# Patient Record
Sex: Female | Born: 1990 | Race: Black or African American | Hispanic: No | Marital: Single | Smoking: Never smoker
Health system: Southern US, Community
[De-identification: ages and names within clinical notes are randomized; demographics above are authoritative.]

## PROBLEM LIST (undated history)

## (undated) ENCOUNTER — Inpatient Hospital Stay (HOSPITAL_COMMUNITY): Payer: Self-pay

## (undated) DIAGNOSIS — Z789 Other specified health status: Secondary | ICD-10-CM

## (undated) HISTORY — PX: NO PAST SURGERIES: SHX2092

---

## 2012-07-20 ENCOUNTER — Emergency Department (HOSPITAL_COMMUNITY)
Admission: EM | Admit: 2012-07-20 | Discharge: 2012-07-20 | Disposition: A | Discharge status: Home / Self Care | Payer: Self-pay | Attending: Emergency Medicine | Admitting: Emergency Medicine

## 2012-07-20 ENCOUNTER — Encounter (HOSPITAL_COMMUNITY): Payer: Self-pay | Admitting: *Deleted

## 2012-07-20 DIAGNOSIS — R21 Rash and other nonspecific skin eruption: Secondary | ICD-10-CM | POA: Insufficient documentation

## 2012-07-20 MED ORDER — PERMETHRIN 5 % EX CREA: TOPICAL_CREAM | CUTANEOUS | Status: AC

## 2012-07-20 NOTE — ED Notes (Signed)
Pt reports holding a baby this weekend with rash on the baby's arms, started to notice itchy rash on her R hand-nowhere else.   Denies any fever.

## 2012-07-20 NOTE — ED Provider Notes (Signed)
History     CSN: [id number]  Arrival date & time 07/20/12  1245   First MD Initiated Contact with Patient 07/20/12 1525      Chief Complaint  Patient presents with  . Rash    (Consider location/radiation/quality/duration/timing/severity/associated sxs/prior treatment) Patient is a 21 y.o. female presenting with rash. The history is provided by the patient.  Rash  This is a new problem. The current episode started yesterday. Associated symptoms comments: She visited a friend yesterday whose daughter had a rash. Last night the patient started having itching on hands with rash this morning. No other symptoms..    History reviewed. No pertinent past medical history.  History reviewed. No pertinent past surgical history.  No family history on file.  History  Substance Use Topics  . Smoking status: Never Smoker   . Smokeless tobacco: Not on file  . Alcohol Use: No    OB History    Grav Para Term Preterm Abortions TAB SAB Ect Mult Living                  Review of Systems  Constitutional: Negative for fever and chills.  HENT: Negative.   Respiratory: Negative.   Gastrointestinal: Negative.   Musculoskeletal: Negative.   Skin: Positive for rash.    Allergies  Review of patient's allergies indicates no known allergies.  Home Medications  No current outpatient prescriptions on file.  BP 120/77  Pulse 90  Temp 98.1 F (36.7 C) (Oral)  Resp 16  SpO2 100%  LMP 06/23/2012  Physical Exam  Constitutional: She is oriented to person, place, and time. She appears well-developed and well-nourished.  Neck: Normal range of motion.  Pulmonary/Chest: Effort normal.  Neurological: She is alert and oriented to person, place, and time.  Skin: Skin is warm and dry.       Red, maculopapular rash to dorsum right hand. No blisters or pustules. No swelling.     ED Course  Procedures (including critical care time)  Labs Reviewed - No data to display No results found.   No  diagnosis found. 1. Nonspecific rash    MDM  Rash is nonspecific but will treat for scabies given exposure and presenting symptoms.        Rodena Medin, PA-C 07/20/12 1529

## 2012-07-21 NOTE — ED Provider Notes (Signed)
Medical screening examination/treatment/procedure(s) were performed by non-physician practitioner and as supervising physician I was immediately available for consultation/collaboration.   Daleysa Kristiansen M Leiyah Maultsby, MD 07/21/12 0114 

## 2013-01-18 ENCOUNTER — Inpatient Hospital Stay (HOSPITAL_COMMUNITY)
Admission: EM | Admit: 2013-01-18 | Discharge: 2013-01-18 | Disposition: A | Discharge status: Home / Self Care | Payer: 59 | Attending: Obstetrics and Gynecology | Admitting: Obstetrics and Gynecology

## 2013-01-18 ENCOUNTER — Encounter (HOSPITAL_COMMUNITY): Payer: Self-pay | Admitting: Emergency Medicine

## 2013-01-18 ENCOUNTER — Emergency Department (HOSPITAL_COMMUNITY): Payer: 59

## 2013-01-18 DIAGNOSIS — O039 Complete or unspecified spontaneous abortion without complication: Secondary | ICD-10-CM | POA: Insufficient documentation

## 2013-01-18 HISTORY — DX: Other specified health status: Z78.9

## 2013-01-18 LAB — URINALYSIS, ROUTINE W REFLEX MICROSCOPIC
Glucose, UA: NEGATIVE mg/dL
Leukocytes, UA: NEGATIVE
Protein, ur: NEGATIVE mg/dL
pH: 6 (ref 5.0–8.0)

## 2013-01-18 LAB — CBC
HCT: 29.7 % — ABNORMAL LOW (ref 36.0–46.0)
Hemoglobin: 10 g/dL — ABNORMAL LOW (ref 12.0–15.0)
MCHC: 33.7 g/dL (ref 30.0–36.0)
RBC: 3.58 MIL/uL — ABNORMAL LOW (ref 3.87–5.11)

## 2013-01-18 LAB — POCT I-STAT, CHEM 8
HCT: 30 % — ABNORMAL LOW (ref 36.0–46.0)
Hemoglobin: 10.2 g/dL — ABNORMAL LOW (ref 12.0–15.0)
Potassium: 3.9 mEq/L (ref 3.5–5.1)
Sodium: 142 mEq/L (ref 135–145)
TCO2: 27 mmol/L (ref 0–100)

## 2013-01-18 LAB — RPR: RPR Ser Ql: NONREACTIVE

## 2013-01-18 LAB — URINE MICROSCOPIC-ADD ON

## 2013-01-18 LAB — HCG, QUANTITATIVE, PREGNANCY: hCG, Beta Chain, Quant, S: 656 m[IU]/mL — ABNORMAL HIGH (ref <5)

## 2013-01-18 LAB — ABO/RH: ABO/RH(D): O POS

## 2013-01-18 MED ORDER — SODIUM CHLORIDE 0.9 % IV SOLN
INTRAVENOUS | Status: DC
  Administered 2013-01-18: 18:00:00 via INTRAVENOUS

## 2013-01-18 MED ORDER — NORGESTIMATE-ETH ESTRADIOL 0.25-35 MG-MCG PO TABS: 1.0000 | ORAL_TABLET | Freq: Every day | ORAL | Status: DC

## 2013-01-18 MED ORDER — METHYLERGONOVINE MALEATE 0.2 MG/ML IJ SOLN
0.2000 mg | Freq: Once | INTRAMUSCULAR | Status: AC
  Administered 2013-01-18: 0.2 mg via INTRAMUSCULAR
  Filled 2013-01-18: qty 1

## 2013-01-18 MED ORDER — IBUPROFEN 600 MG PO TABS: 600.0000 mg | ORAL_TABLET | Freq: Four times a day (QID) | ORAL | Status: DC | PRN

## 2013-01-18 NOTE — ED Notes (Signed)
Patient transported to Ultrasound 

## 2013-01-18 NOTE — MAU Provider Note (Signed)
  History     CSN: [id number]  Arrival date and time: 01/18/13 1427   First Elijan Googe Initiated Contact with Patient 01/18/13 1958      Chief Complaint  Patient presents with  . Vaginal Bleeding   HPI HPI  The patient presents to the emergency room with complaints of a probable miscarriage. Her last menstrual period was sometime in December. Patient (G2P1 , prior spontaneous ab) states that 2 weeks ago she believes she had a miscarriage. She had one previously so she was not too concerned. She never saw an OB GYN with this pregnancy but had taken a home pregnancy test. She noticed heavy menstrual bleeding. She is passing clots but does not recall if she passed any tissue. The patient started bleeding heavily again today. She's having lower abdominal cramping as well. The patient states that she's not sure if she had a fever yesterday. She did not measure her temperature she developed a mild headache and had to take some Tylenol. The patient denies any numbness or weakness. No vomiting or diarrhea. She does not feel lightheaded.  History reviewed. No pertinent past medical history. PATIENT IS SENT TO [facility] FOR CONTINUED EVAL AND CARE. SHE IS NOT CRAMPING, AND DR  Lynelle Doctor was able to extract tissue from os, and bleeding has essentially stopped    Past Medical History  Diagnosis Date  . Medical history non-contributory     Past Surgical History  Procedure Laterality Date  . No past surgeries      History reviewed. No pertinent family history.  History  Substance Use Topics  . Smoking status: Never Smoker   . Smokeless tobacco: Not on file  . Alcohol Use: No    Allergies: No Known Allergies  Prescriptions prior to admission  Medication Sig Dispense Refill  . aspirin 325 MG tablet Take 650 mg by mouth once.      Marland Kitchen BIOTIN PO Take 1 capsule by mouth daily.        ROS Physical Exam   Blood pressure 128/62, pulse 80, temperature 98.6 F (37 C), temperature source Oral, resp. rate  18, height 5\' 7"  (1.702 m), weight 90.719 kg (200 lb), last menstrual period 10/16/2012, SpO2 98.00%.  Physical Exam Physical Examination: General appearance - alert, well appearing, and in no distress, oriented to person, place, and time, acyanotic, in no respiratory distress, well hydrated and comfortable Mental status - alert, oriented to person, place, and time, normal mood, behavior, speech, dress, motor activity, and thought processes Pelvic - normal external genitalia, vulva, vagina, cervix, uterus and adnexa, VULVA: normal appearing vulva with no masses, tenderness or lesions, VAGINA: normal appearing vagina with normal color and discharge, no lesions, moderate clots, no active bleedign, CERVIX: normal appearing cervix without discharge or lesions, open os allows , UTERUS: uterus is normal size, shape, consistency and nontender, anteverted, able to be explored with ring forceps, removing only clots, no residual tissue. O POS   MAU Course  Procedures  MDM  Assessment and Plan  Spontaneous abortion, completed Plan:  D/c home             Contraception: Sprintec , to begin in 2 weeks.  FERGUSON,JOHN V 01/18/2013, 8:22 PM

## 2013-01-18 NOTE — ED Notes (Addendum)
Per NT, Pt still has heavy bleeding and has passed several clots, bigger than a quarter. MD notified.

## 2013-01-18 NOTE — ED Provider Notes (Signed)
History    CSN: [id number] Arrival date & time 01/18/13  1427 First MD Initiated Contact with Patient 01/18/13 1505      Chief Complaint  Patient presents with  . Vaginal Bleeding    HPI The patient presents to the emergency room with complaints of a probable miscarriage. Her last menstrual period was sometime in December. Patient (G2P1 , prior spontaneous ab) states that 2 weeks ago she believes she had a miscarriage. She had one previously so she was not too concerned.  She never saw an OB GYN with this pregnancy but had taken a home pregnancy test. She noticed heavy menstrual bleeding. She is passing clots but does not recall if she passed any tissue. The patient started bleeding heavily again today. She's having lower abdominal cramping as well. The patient states that she's not sure if she had a fever yesterday. She did not measure her temperature she developed a mild headache and had to take some Tylenol. The patient denies any numbness or weakness. No vomiting or diarrhea. She does not feel lightheaded. History reviewed. No pertinent past medical history.  History reviewed. No pertinent past surgical history.  History reviewed. No pertinent family history.  History  Substance Use Topics  . Smoking status: Never Smoker   . Smokeless tobacco: Not on file  . Alcohol Use: No    OB History   Grav Para Term Preterm Abortions TAB SAB Ect Mult Living                  Review of Systems  All other systems reviewed and are negative.    Allergies  Review of patient's allergies indicates no known allergies.  Home Medications   Current Outpatient Rx  Name  Route  Sig  Dispense  Refill  . aspirin 325 MG tablet   Oral   Take 650 mg by mouth once.         Marland Kitchen BIOTIN PO   Oral   Take 1 capsule by mouth daily.           BP 135/80  Pulse 83  Temp(Src) 98.1 F (36.7 C) (Oral)  Resp 16  SpO2 100%  LMP 10/16/2012  Physical Exam  Nursing note and vitals  reviewed. Constitutional: She appears well-developed and well-nourished. No distress.  HENT:  Head: Normocephalic and atraumatic.  Right Ear: External ear normal.  Left Ear: External ear normal.  Eyes: Conjunctivae are normal. Right eye exhibits no discharge. Left eye exhibits no discharge. No scleral icterus.  Neck: Neck supple. No tracheal deviation present.  Cardiovascular: Normal rate, regular rhythm and intact distal pulses.   Pulmonary/Chest: Effort normal and breath sounds normal. No stridor. No respiratory distress. She has no wheezes. She has no rales.  Abdominal: Soft. Bowel sounds are normal. She exhibits no distension and no mass. There is tenderness in the suprapubic area. There is no rebound and no guarding. No hernia. Hernia confirmed negative in the right inguinal area and confirmed negative in the left inguinal area.  Mild in the suprapubic region  Genitourinary: There is no rash on the right labia. There is no rash on the left labia. Uterus is tender. Cervix exhibits no motion tenderness. Right adnexum displays no mass and no tenderness. Left adnexum displays no mass and no tenderness. There is bleeding around the vagina. No tenderness around the vagina.  Large mass extruding from the cervical os the is consistent with retained products of conception, larger than the size of a golf ball,  cervical os is open  Musculoskeletal: She exhibits no edema and no tenderness.  Neurological: She is alert. She has normal strength. No sensory deficit. Cranial nerve deficit:  no gross defecits noted. She exhibits normal muscle tone. She displays no seizure activity. Coordination normal.  Skin: Skin is warm and dry. No rash noted.  Psychiatric: She has a normal mood and affect.    ED Course  Procedures (including critical care time) ED Course During her pelvic exam the tissue was removed from the cervical os using ring forceps.  The tissue be saved and sent for pathology   Medications   0.9 %  sodium chloride infusion ( Intravenous New Bag/Given 01/18/13 1755)  methylergonovine (METHERGINE) injection 0.2 mg (0.2 mg Intramuscular Given 01/18/13 1755)    Labs Reviewed  PREGNANCY, URINE - Abnormal; Notable for the following:    Preg Test, Ur POSITIVE (*)    All other components within normal limits  URINALYSIS, ROUTINE W REFLEX MICROSCOPIC - Abnormal; Notable for the following:    Hgb urine dipstick LARGE (*)    Ketones, ur TRACE (*)    All other components within normal limits  CBC - Abnormal; Notable for the following:    RBC 3.58 (*)    Hemoglobin 10.0 (*)    HCT 29.7 (*)    All other components within normal limits  HCG, QUANTITATIVE, PREGNANCY - Abnormal; Notable for the following:    hCG, Beta Chain, Quant, S 656 (*)    All other components within normal limits  POCT I-STAT, CHEM 8 - Abnormal; Notable for the following:    Hemoglobin 10.2 (*)    HCT 30.0 (*)    All other components within normal limits  GC/CHLAMYDIA PROBE AMP  URINE MICROSCOPIC-ADD ON  RPR  ABO/RH  SURGICAL PATHOLOGY   Yanni Transvaginal Non-ob  01/18/2013  *RADIOLOGY REPORT*  Clinical Data: Spontaneous miscarriage 2 weeks ago.  New-onset heavy bleeding today.  Quantitative HCG 656  TRANSABDOMINAL AND TRANSVAGINAL ULTRASOUND OF PELVIS Technique:  Both transabdominal and transvaginal ultrasound examinations of the pelvis were performed. Transabdominal technique was performed for global imaging of the pelvis including uterus, ovaries, adnexal regions, and pelvic cul-de-sac.  It was necessary to proceed with endovaginal exam following the transabdominal exam to visualize the endometrium.  Comparison:  None  Findings:  Uterus: Normal in size and appearance  Endometrium: Markedly thickened at 2.0 cm with heterogeneously echogenic material.  There is Doppler signal within the endometrium.  Right ovary:  Normal appearance/no adnexal mass  Left ovary: Normal appearance/no adnexal mass  Other findings: No free  fluid  IMPRESSION: Findings compatible with retained products of conception.   Original Report Authenticated By: Holley Dexter, M.D.    Lakisha Pelvis Complete  01/18/2013  *RADIOLOGY REPORT*  Clinical Data: Spontaneous miscarriage 2 weeks ago.  New-onset heavy bleeding today.  Quantitative HCG 656  TRANSABDOMINAL AND TRANSVAGINAL ULTRASOUND OF PELVIS Technique:  Both transabdominal and transvaginal ultrasound examinations of the pelvis were performed. Transabdominal technique was performed for global imaging of the pelvis including uterus, ovaries, adnexal regions, and pelvic cul-de-sac.  It was necessary to proceed with endovaginal exam following the transabdominal exam to visualize the endometrium.  Comparison:  None  Findings:  Uterus: Normal in size and appearance  Endometrium: Markedly thickened at 2.0 cm with heterogeneously echogenic material.  There is Doppler signal within the endometrium.  Right ovary:  Normal appearance/no adnexal mass  Left ovary: Normal appearance/no adnexal mass  Other findings: No free fluid  IMPRESSION:  Findings compatible with retained products of conception.   Original Report Authenticated By: Holley Dexter, M.D.      1. Incomplete abortion       MDM  Illeana shows retained products of conception..  I discussed the case with Dr. Emelda Fear. The patient be transferred to [facility] hospital for further evaluation. She remains hemodynamically stable. We will continue to monitor.  I discussed the findings and plan with the patient and she agrees        Celene Kras, MD 01/18/13 [phone]

## 2013-01-18 NOTE — ED Notes (Signed)
Nurse to attempt to obtain labs with IV start

## 2013-01-18 NOTE — MAU Note (Signed)
"  A couple of weeks ago I had a miscarriage in [place].  The bleeding stopped until I was in class today.  So, my instructor took me to [employer].  The doctor there stuck some scissor like instrument in there to pull stuff out.  They told me that I would have to come here to have surgery to have it scraped out."

## 2013-01-18 NOTE — MAU Note (Signed)
Arrival via [facility] from [facility]

## 2013-01-18 NOTE — ED Notes (Signed)
Pt states that 2 weeks ago she had a miscarriage while she was at [employer].  Guesstimates that she was about [redacted] wks along.  States that it was so bad that they had to shut the bathrooms down due to the bleeding.  States that eventually after a few days, she stopped bleeding but today it started again.  States she is bleeding through her pad onto her clothes and there are clots.

## 2013-01-20 LAB — GC/CHLAMYDIA PROBE AMP: CT Probe RNA: POSITIVE — AB

## 2013-01-21 ENCOUNTER — Telehealth (HOSPITAL_COMMUNITY): Payer: Self-pay | Admitting: Emergency Medicine

## 2013-01-21 NOTE — ED Notes (Signed)
Patient has +Chlamydia. Checking to see if treated appropriately. °

## 2013-01-21 NOTE — ED Notes (Signed)
+  Chalmydia. Chart sent to EDP office for review. [employer]HS attached.

## 2013-01-24 ENCOUNTER — Telehealth (HOSPITAL_COMMUNITY): Payer: Self-pay | Admitting: *Deleted

## 2013-01-24 NOTE — ED Notes (Signed)
rx for Zithromax 1 gram po x 1 written by Dr Juleen China needs to be called to pharmacy.

## 2013-01-28 ENCOUNTER — Telehealth (HOSPITAL_COMMUNITY): Payer: Self-pay | Admitting: Emergency Medicine

## 2013-01-29 ENCOUNTER — Telehealth (HOSPITAL_COMMUNITY): Payer: Self-pay | Admitting: Emergency Medicine

## 2013-01-29 NOTE — ED Notes (Signed)
Rx called in to [facility] on [address] by Carollee Herter Gammons PFM.

## 2013-02-08 ENCOUNTER — Telehealth: Payer: Self-pay

## 2013-02-08 NOTE — Telephone Encounter (Signed)
Pt called and stated that she has a miscarriage in which Dr.  Emelda Fear prescribed BCP's and now she is having constant bleeding.  Called pt and pt informed me what is stated above.  Pt also informed me that she started taking BCP's the following Sunday of her miscarriage.  According to Dr. Rayna Sexton note- Pt was to start BCP's in 2 weeks.  I explained to pt that she having constant bleeding due to the fact of her body not being stable with hormones and to keep taking pill as prescribed and to please give it till the end of next pill pack to her body time to regulate.  Pt stated understanding and did not have any other questions.

## 2013-11-23 ENCOUNTER — Encounter (HOSPITAL_COMMUNITY): Payer: Self-pay | Admitting: *Deleted

## 2013-12-07 ENCOUNTER — Other Ambulatory Visit: Payer: Self-pay | Admitting: *Deleted

## 2013-12-07 ENCOUNTER — Encounter: Payer: Self-pay | Admitting: Obstetrics & Gynecology

## 2013-12-07 ENCOUNTER — Ambulatory Visit (INDEPENDENT_AMBULATORY_CARE_PROVIDER_SITE_OTHER): Payer: Medicaid Other | Admitting: Obstetrics & Gynecology

## 2013-12-07 VITALS — BP 136/75 | Temp 97.0°F | Wt 218.0 lb

## 2013-12-07 DIAGNOSIS — Z3200 Encounter for pregnancy test, result unknown: Secondary | ICD-10-CM

## 2013-12-07 DIAGNOSIS — O2620 Pregnancy care for patient with recurrent pregnancy loss, unspecified trimester: Secondary | ICD-10-CM

## 2013-12-07 DIAGNOSIS — Z3201 Encounter for pregnancy test, result positive: Secondary | ICD-10-CM

## 2013-12-07 DIAGNOSIS — Z3400 Encounter for supervision of normal first pregnancy, unspecified trimester: Secondary | ICD-10-CM

## 2013-12-07 LAB — POCT URINALYSIS DIPSTICK
BILIRUBIN UA: NEGATIVE
Blood, UA: NEGATIVE
GLUCOSE UA: NEGATIVE
Leukocytes, UA: NEGATIVE
Nitrite, UA: NEGATIVE
SPEC GRAV UA: 1.02
Urobilinogen, UA: NEGATIVE
pH, UA: 5

## 2013-12-07 LAB — POCT URINE PREGNANCY: Preg Test, Ur: POSITIVE

## 2013-12-07 LAB — TSH: TSH: 2.511 u[IU]/mL (ref 0.350–4.500)

## 2013-12-07 LAB — T4, FREE: FREE T4: 1.34 ng/dL (ref 0.80–1.80)

## 2013-12-07 LAB — HIV ANTIBODY (ROUTINE TESTING W REFLEX): HIV: NONREACTIVE

## 2013-12-07 MED ORDER — PROGESTERONE MICRONIZED 200 MG PO CAPS: 200.0000 mg | ORAL_CAPSULE | Freq: Every day | ORAL | Status: DC

## 2013-12-07 NOTE — Patient Instructions (Signed)
Pregnancy - First Trimester During sexual intercourse, millions of sperm go into the vagina. Only 1 sperm will penetrate and fertilize the female egg while it is in the Fallopian tube. One week later, the fertilized egg implants into the wall of the uterus. An embryo begins to develop into a baby. At 6 to 8 weeks, the eyes and face are formed and the heartbeat can be seen on ultrasound. At the end of 12 weeks (first trimester), all the baby's organs are formed. Now that you are pregnant, you will want to do everything you can to have a healthy baby. Two of the most important things are to get good prenatal care and follow your caregiver's instructions. Prenatal care is all the medical care you receive before the baby's birth. It is given to prevent, find, and treat problems during the pregnancy and childbirth. PRENATAL EXAMS  During prenatal visits, your weight, blood pressure, and urine are checked. This is done to make sure you are healthy and progressing normally during the pregnancy.  A pregnant woman should gain 25 to 35 pounds during the pregnancy. However, if you are overweight or underweight, your caregiver will advise you regarding your weight.  Your caregiver will ask and answer questions for you.  Blood work, cervical cultures, other necessary tests, and a Pap test are done during your prenatal exams. These tests are done to check on your health and the probable health of your baby. Tests are strongly recommended and done for HIV with your permission. This is the virus that causes AIDS. These tests are done because medicines can be given to help prevent your baby from being born with this infection should you have been infected without knowing it. Blood work is also used to find out your blood type, previous infections, and follow your blood levels (hemoglobin).  Low hemoglobin (anemia) is common during pregnancy. Iron and vitamins are given to help prevent this. Later in the pregnancy,  blood tests for diabetes will be done along with any other tests if any problems develop.  You may need other tests to make sure you and the baby are doing well. CHANGES DURING THE FIRST TRIMESTER  Your body goes through many changes during pregnancy. They vary from person to person. Talk to your caregiver about changes you notice and are concerned about. Changes can include:  Your menstrual period stops.  The egg and sperm carry the genes that determine what you look like. Genes from you and your partner are forming a baby. The female genes determine whether the baby is a boy or a girl.  Your body increases in girth and you may feel bloated.  Feeling sick to your stomach (nauseous) and throwing up (vomiting). If the vomiting is uncontrollable, call your caregiver.  Your breasts will begin to enlarge and become tender.  Your nipples may stick out more and become darker.  The need to urinate more. Painful urination may mean you have a bladder infection.  Tiring easily.  Loss of appetite.  Cravings for certain kinds of food.  At first, you may gain or lose a couple of pounds.  You may have changes in your emotions from day to day (excited to be pregnant or concerned something may go wrong with the pregnancy and baby).  You may have more vivid and strange dreams. HOME CARE INSTRUCTIONS   It is very important to avoid all smoking, alcohol and non-prescribed drugs during your pregnancy. These affect the formation and growth of the baby.   Avoid chemicals while pregnant to ensure the delivery of a healthy infant.  Start your prenatal visits by the 12th week of pregnancy. They are usually scheduled monthly at first, then more often in the last 2 months before delivery. Keep your caregiver's appointments. Follow your caregiver's instructions regarding medicine use, blood and lab tests, exercise, and diet.  During pregnancy, you are providing food for you and your baby. Eat regular,  well-balanced meals. Choose foods such as meat, fish, milk and other low fat dairy products, vegetables, fruits, and whole-grain breads and cereals. Your caregiver will tell you of the ideal weight gain.  You can help morning sickness by keeping soda crackers at the bedside. Eat a couple before arising in the morning. You may want to use the crackers without salt on them.  Eating 4 to 5 small meals rather than 3 large meals a day also may help the nausea and vomiting.  Drinking liquids between meals instead of during meals also seems to help nausea and vomiting.  A physical sexual relationship may be continued throughout pregnancy if there are no other problems. Problems may be early (premature) leaking of amniotic fluid from the membranes, vaginal bleeding, or belly (abdominal) pain.  Exercise regularly if there are no restrictions. Check with your caregiver or physical therapist if you are unsure of the safety of some of your exercises. Greater weight gain will occur in the last 2 trimesters of pregnancy. Exercising will help:  Control your weight.  Keep you in shape.  Prepare you for labor and delivery.  Help you lose your pregnancy weight after you deliver your baby.  Wear a good support or jogging bra for breast tenderness during pregnancy. This may help if worn during sleep too.  Ask when prenatal classes are available. Begin classes when they are offered.  Do not use hot tubs, steam rooms, or saunas.  Wear your seat belt when driving. This protects you and your baby if you are in an accident.  Avoid raw meat, uncooked cheese, cat litter boxes, and soil used by cats throughout the pregnancy. These carry germs that can cause birth defects in the baby.  The first trimester is a good time to visit your dentist for your dental health. Getting your teeth cleaned is okay. Use a softer toothbrush and brush gently during pregnancy.  Ask for help if you have financial, counseling, or  nutritional needs during pregnancy. Your caregiver will be able to offer counseling for these needs as well as refer you for other special needs.  Do not take any medicines or herbs unless told by your caregiver.  Inform your caregiver if there is any mental or physical domestic violence.  Make a list of emergency phone numbers of family, friends, hospital, and police and fire departments.  Write down your questions. Take them to your prenatal visit.  Do not douche.  Do not cross your legs.  If you have to stand for long periods of time, rotate you feet or take small steps in a circle.  You may have more vaginal secretions that may require a sanitary pad. Do not use tampons or scented sanitary pads. MEDICINES AND DRUG USE IN PREGNANCY  Take prenatal vitamins as directed. The vitamin should contain 1 milligram of folic acid. Keep all vitamins out of reach of children. Only a couple vitamins or tablets containing iron may be fatal to a baby or young child when ingested.  Avoid use of all medicines, including herbs, over-the-counter medicines, not   prescribed or suggested by your caregiver. Only take over-the-counter or prescription medicines for pain, discomfort, or fever as directed by your caregiver. Do not use aspirin, ibuprofen, or naproxen unless directed by your caregiver.  Let your caregiver also know about herbs you may be using.  Alcohol is related to a number of birth defects. This includes fetal alcohol syndrome. All alcohol, in any form, should be avoided completely. Smoking will cause low birth rate and premature babies.  Street or illegal drugs are very harmful to the baby. They are absolutely forbidden. A baby born to an addicted mother will be addicted at birth. The baby will go through the same withdrawal an adult does.  Let your caregiver know about any medicines that you have to take and for what reason you take them. SEEK MEDICAL CARE IF:  You have any concerns or  worries during your pregnancy. It is better to call with your questions if you feel they cannot wait, rather than worry about them. SEEK IMMEDIATE MEDICAL CARE IF:   An unexplained oral temperature above 102 F (38.9 C) develops, or as your caregiver suggests.  You have leaking of fluid from the vagina (birth canal). If leaking membranes are suspected, take your temperature and inform your caregiver of this when you call.  There is vaginal spotting or bleeding. Notify your caregiver of the amount and how many pads are used.  You develop a bad smelling vaginal discharge with a change in the color.  You continue to feel sick to your stomach (nauseated) and have no relief from remedies suggested. You vomit blood or coffee ground-like materials.  You lose more than 2 pounds of weight in 1 week.  You gain more than 2 pounds of weight in 1 week and you notice swelling of your face, hands, feet, or legs.  You gain 5 pounds or more in 1 week (even if you do not have swelling of your hands, face, legs, or feet).  You get exposed to German measles and have never had them.  You are exposed to fifth disease or chickenpox.  You develop belly (abdominal) pain. Round ligament discomfort is a common non-cancerous (benign) cause of abdominal pain in pregnancy. Your caregiver still must evaluate this.  You develop headache, fever, diarrhea, pain with urination, or shortness of breath.  You fall or are in a car accident or have any kind of trauma.  There is mental or physical violence in your home. Document Released: 10/27/2001 Document Revised: 07/27/2012 Document Reviewed: 04/30/2009 [employer] Patient Information 2014 [employer].  Breastfeeding Deciding to breastfeed is one of the best choices you can make for you and your baby. A change in hormones during pregnancy causes your breast tissue to grow and increases the number and size of your milk ducts. These hormones also allow proteins,  sugars, and fats from your blood supply to make breast milk in your milk-producing glands. Hormones prevent breast milk from being released before your baby is born as well as prompt milk flow after birth. Once breastfeeding has begun, thoughts of your baby, as well as his or her sucking or crying, can stimulate the release of milk from your milk-producing glands.  BENEFITS OF BREASTFEEDING For Your Baby  Your first milk (colostrum) helps your baby's digestive system function better.   There are antibodies in your milk that help your baby fight off infections.   Your baby has a lower incidence of asthma, allergies, and sudden infant death syndrome.   The nutrients   in breast milk are better for your baby than infant formulas and are designed uniquely for your baby's needs.   Breast milk improves your baby's brain development.   Your baby is less likely to develop other conditions, such as childhood obesity, asthma, or type 2 diabetes mellitus.  For You   Breastfeeding helps to create a very special bond between you and your baby.   Breastfeeding is convenient. Breast milk is always available at the correct temperature and costs nothing.   Breastfeeding helps to burn calories and helps you lose the weight gained during pregnancy.   Breastfeeding makes your uterus contract to its prepregnancy size faster and slows bleeding (lochia) after you give birth.   Breastfeeding helps to lower your risk of developing type 2 diabetes mellitus, osteoporosis, and breast or ovarian cancer later in life. SIGNS THAT YOUR BABY IS HUNGRY Early Signs of Hunger  Increased alertness or activity.  Stretching.  Movement of the head from side to side.  Movement of the head and opening of the mouth when the corner of the mouth or cheek is stroked (rooting).  Increased sucking sounds, smacking lips, cooing, sighing, or squeaking.  Hand-to-mouth movements.  Increased sucking of fingers or  hands. Late Signs of Hunger  Fussing.  Intermittent crying. Extreme Signs of Hunger Signs of extreme hunger will require calming and consoling before your baby will be able to breastfeed successfully. Do not wait for the following signs of extreme hunger to occur before you initiate breastfeeding:   Restlessness.  A loud, strong cry.   Screaming. BREASTFEEDING BASICS Breastfeeding Initiation  Find a comfortable place to sit or lie down, with your neck and back well supported.  Place a pillow or rolled up blanket under your baby to bring him or her to the level of your breast (if you are seated). Nursing pillows are specially designed to help support your arms and your baby while you breastfeed.  Make sure that your baby's abdomen is facing your abdomen.   Gently massage your breast. With your fingertips, massage from your chest wall toward your nipple in a circular motion. This encourages milk flow. You may need to continue this action during the feeding if your milk flows slowly.  Support your breast with 4 fingers underneath and your thumb above your nipple. Make sure your fingers are well away from your nipple and your baby's mouth.   Stroke your baby's lips gently with your finger or nipple.   When your baby's mouth is open wide enough, quickly bring your baby to your breast, placing your entire nipple and as much of the colored area around your nipple (areola) as possible into your baby's mouth.   More areola should be visible above your baby's upper lip than below the lower lip.   Your baby's tongue should be between his or her lower gum and your breast.   Ensure that your baby's mouth is correctly positioned around your nipple (latched). Your baby's lips should create a seal on your breast and be turned out (everted).  It is common for your baby to suck about 2 3 minutes in order to start the flow of breast milk. Latching Teaching your baby how to latch on to your  breast properly is very important. An improper latch can cause nipple pain and decreased milk supply for you and poor weight gain in your baby. Also, if your baby is not latched onto your nipple properly, he or she may swallow some air during   feeding. This can make your baby fussy. Burping your baby when you switch breasts during the feeding can help to get rid of the air. However, teaching your baby to latch on properly is still the best way to prevent fussiness from swallowing air while breastfeeding. Signs that your baby has successfully latched on to your nipple:    Silent tugging or silent sucking, without causing you pain.   Swallowing heard between every 3 4 sucks.    Muscle movement above and in front of his or her ears while sucking.  Signs that your baby has not successfully latched on to nipple:   Sucking sounds or smacking sounds from your baby while breastfeeding.  Nipple pain. If you think your baby has not latched on correctly, slip your finger into the corner of your baby's mouth to break the suction and place it between your baby's gums. Attempt breastfeeding initiation again. Signs of Successful Breastfeeding Signs from your baby:   A gradual decrease in the number of sucks or complete cessation of sucking.   Falling asleep.   Relaxation of his or her body.   Retention of a small amount of milk in his or her mouth.   Letting go of your breast by himself or herself. Signs from you:  Breasts that have increased in firmness, weight, and size 1 3 hours after feeding.   Breasts that are softer immediately after breastfeeding.  Increased milk volume, as well as a change in milk consistency and color by the 5th day of breastfeeding.   Nipples that are not sore, cracked, or bleeding. Signs That Your Baby is Getting Enough Milk  Wetting at least 3 diapers in a 24-hour period. The urine should be clear and pale yellow by age 5 days.  At least 3 stools in a  24-hour period by age 5 days. The stool should be soft and yellow.  At least 3 stools in a 24-hour period by age 7 days. The stool should be seedy and yellow.  No loss of weight greater than 10% of birth weight during the first 3 days of age.  Average weight gain of 4 7 ounces (120 210 mL) per week after age 4 days.  Consistent daily weight gain by age 5 days, without weight loss after the age of 2 weeks. After a feeding, your baby may spit up a small amount. This is common. BREASTFEEDING FREQUENCY AND DURATION Frequent feeding will help you make more milk and can prevent sore nipples and breast engorgement. Breastfeed when you feel the need to reduce the fullness of your breasts or when your baby shows signs of hunger. This is called "breastfeeding on demand." Avoid introducing a pacifier to your baby while you are working to establish breastfeeding (the first 4 6 weeks after your baby is born). After this time you may choose to use a pacifier. Research has shown that pacifier use during the first year of a baby's life decreases the risk of sudden infant death syndrome (SIDS). Allow your baby to feed on each breast as long as he or she wants. Breastfeed until your baby is finished feeding. When your baby unlatches or falls asleep while feeding from the first breast, offer the second breast. Because newborns are often sleepy in the first few weeks of life, you may need to awaken your baby to get him or her to feed. Breastfeeding times will vary from baby to baby. However, the following rules can serve as a guide to help you   ensure that your baby is properly fed:  Newborns (babies 4 weeks of age or younger) may breastfeed every 1 3 hours.  Newborns should not go longer than 3 hours during the day or 5 hours during the night without breastfeeding.  You should breastfeed your baby a minimum of 8 times in a 24-hour period until you begin to introduce solid foods to your baby at around 6 months of  age. BREAST MILK PUMPING Pumping and storing breast milk allows you to ensure that your baby is exclusively fed your breast milk, even at times when you are unable to breastfeed. This is especially important if you are going back to work while you are still breastfeeding or when you are not able to be present during feedings. Your lactation consultant can give you guidelines on how long it is safe to store breast milk.  A breast pump is a machine that allows you to pump milk from your breast into a sterile bottle. The pumped breast milk can then be stored in a refrigerator or freezer. Some breast pumps are operated by hand, while others use electricity. Ask your lactation consultant which type will work best for you. Breast pumps can be purchased, but some hospitals and breastfeeding support groups lease breast pumps on a monthly basis. A lactation consultant can teach you how to hand express breast milk, if you prefer not to use a pump.  CARING FOR YOUR BREASTS WHILE YOU BREASTFEED Nipples can become dry, cracked, and sore while breastfeeding. The following recommendations can help keep your breasts moisturized and healthy:  Avoid using soap on your nipples.   Wear a supportive bra. Although not required, special nursing bras and tank tops are designed to allow access to your breasts for breastfeeding without taking off your entire bra or top. Avoid wearing underwire style bras or extremely tight bras.  Air dry your nipples for 3 4minutes after each feeding.   Use only cotton bra pads to absorb leaked breast milk. Leaking of breast milk between feedings is normal.   Use lanolin on your nipples after breastfeeding. Lanolin helps to maintain your skin's normal moisture barrier. If you use pure lanolin you do not need to wash it off before feeding your baby again. Pure lanolin is not toxic to your baby. You may also hand express a few drops of breast milk and gently massage that milk into your  nipples and allow the milk to air dry. In the first few weeks after giving birth, some women experience extremely full breasts (engorgement). Engorgement can make your breasts feel heavy, warm, and tender to the touch. Engorgement peaks within 3 5 days after you give birth. The following recommendations can help ease engorgement:  Completely empty your breasts while breastfeeding or pumping. You may want to start by applying warm, moist heat (in the shower or with warm water-soaked hand towels) just before feeding or pumping. This increases circulation and helps the milk flow. If your baby does not completely empty your breasts while breastfeeding, pump any extra milk after he or she is finished.  Wear a snug bra (nursing or regular) or tank top for 1 2 days to signal your body to slightly decrease milk production.  Apply ice packs to your breasts, unless this is too uncomfortable for you.  Make sure that your baby is latched on and positioned properly while breastfeeding. If engorgement persists after 48 hours of following these recommendations, contact your health care provider or a lactation consultant. OVERALL   HEALTH CARE RECOMMENDATIONS WHILE BREASTFEEDING  Eat healthy foods. Alternate between meals and snacks, eating 3 of each per day. Because what you eat affects your breast milk, some of the foods may make your baby more irritable than usual. Avoid eating these foods if you are sure that they are negatively affecting your baby.  Drink milk, fruit juice, and water to satisfy your thirst (about 10 glasses a day).   Rest often, relax, and continue to take your prenatal vitamins to prevent fatigue, stress, and anemia.  Continue breast self-awareness checks.  Avoid chewing and smoking tobacco.  Avoid alcohol and drug use. Some medicines that may be harmful to your baby can pass through breast milk. It is important to ask your health care provider before taking any medicine, including all  over-the-counter and prescription medicine as well as vitamin and herbal supplements. It is possible to become pregnant while breastfeeding. If birth control is desired, ask your health care provider about options that will be safe for your baby. SEEK MEDICAL CARE IF:   You feel like you want to stop breastfeeding or have become frustrated with breastfeeding.  You have painful breasts or nipples.  Your nipples are cracked or bleeding.  Your breasts are red, tender, or warm.  You have a swollen area on either breast.  You have a fever or chills.  You have nausea or vomiting.  You have drainage other than breast milk from your nipples.  Your breasts do not become full before feedings by the 5th day after you give birth.  You feel sad and depressed.  Your baby is too sleepy to eat well.  Your baby is having trouble sleeping.   Your baby is wetting less than 3 diapers in a 24-hour period.  Your baby has less than 3 stools in a 24-hour period.  Your baby's skin or the white part of his or her eyes becomes yellow.   Your baby is not gaining weight by 5 days of age. SEEK IMMEDIATE MEDICAL CARE IF:   Your baby is overly tired (lethargic) and does not want to wake up and feed.  Your baby develops an unexplained fever. Document Released: 11/02/2005 Document Revised: 07/05/2013 Document Reviewed: 04/26/2013 [employer] Patient Information 2014 [employer].  

## 2013-12-07 NOTE — Addendum Note (Signed)
Addended by: Glendell DockerKNIGHT, Jahnay Lantier on: 12/07/2013 10:34 AM   Modules accepted: Orders

## 2013-12-07 NOTE — Progress Notes (Signed)
Pulse: 89 Patient states she is having a Le[place] lower abdominal pain but nothing that she is concerned about.  Subjective:    Lynn Holland is being seen today for her first obstetrical visit.  This is not a planned pregnancy. She is at 4924w1d gestation. Her obstetrical history is significant for two previous SAB. Relationship with FOB: significant other, not living together. Patient does intend to breast feed. Pregnancy history fully reviewed.  Menstrual History: OB History   Grav Para Term Preterm Abortions TAB SAB Ect Mult Living   3    2  2    0      Menarche age: [age]  Patient's last menstrual period was 11/01/2013.    The following portions of the patient's history were reviewed and updated as appropriate: allergies, current medications, past family history, past medical history, past social history, past surgical history and problem list.  Review of Systems Pertinent items are noted in HPI.    Objective:      General Appearance:    Alert, cooperative, no distress, appears stated age  Head:    Normocephalic, without obvious abnormality, atraumatic  Eyes:    PERRL, conjunctiva/corneas clear, EOM's intact, fundi    benign, both eyes  Ears:    Normal TM's and external ear canals, both ears  Nose:   Nares normal, septum midline, mucosa normal, no drainage    or sinus tenderness  Throat:   Lips, mucosa, and tongue normal; teeth and gums normal  Neck:   Supple, symmetrical, trachea midline, no adenopathy;    thyroid:  no enlargement/tenderness/nodules; no carotid   bruit or JVD  Back:     Symmetric, no curvature, ROM normal, no CVA tenderness  Lungs:     Clear to auscultation bilaterally, respirations unlabored  Chest Wall:    No tenderness or deformity   Heart:    Regular rate and rhythm, S1 and S2 normal, no murmur, rub   or gallop  Breast Exam:    No tenderness, masses, or nipple abnormality  Abdomen:     Soft, non-tender, bowel sounds active all four quadrants,    no masses, no  organomegaly  Genitalia:    Normal female without lesion, discharge or tenderness  Extremities:   Extremities normal, atraumatic, no cyanosis or edema  Pulses:   2+ and symmetric all extremities  Skin:   Skin color, texture, turgor normal, no rashes or lesions  Lymph nodes:   Cervical, supraclavicular, and axillary nodes normal  Neurologic:   CNII-XII intact, normal strength, sensation and reflexes    throughout        Assessment:    Pregnancy at 3224w1d weeks   H/O recurrent SAB Plan:  Progesterone support  Initial labs drawn. Prenatal vitamins.  Counseling provided regarding continued use of seat belts, cessation of alcohol consumption, smoking or use of illicit drugs; infection precautions i.e., influenza/TDAP immunizations, toxoplasmosis,CMV, parvovirus, listeria and varicella; workplace safety, exercise during pregnancy; routine dental care, safe medications, sexual activity, hot tubs, saunas, pools, travel, caffeine use, fish and methlymercury, potential toxins, hair treatments, varicose veins Weight gain recommendations per IOM guidelines reviewed:  overweight/BMI 25 - 29.9--> gain 15 - 25 lbs Problem list reviewed and updated. Follow up in 1 weeks. 50% of 20 min visit spent on counseling and coordination of care.

## 2013-12-08 ENCOUNTER — Encounter: Payer: Self-pay | Admitting: Obstetrics & Gynecology

## 2013-12-08 LAB — CARDIOLIPIN ANTIBODIES, IGM+IGG
Anticardiolipin IgG: 13 GPL U/mL (ref <23)
Anticardiolipin IgM: 0 MPL U/mL (ref <11)

## 2013-12-08 LAB — GC/CHLAMYDIA PROBE AMP
CT Probe RNA: NEGATIVE
GC Probe RNA: NEGATIVE

## 2013-12-08 LAB — OBSTETRIC PANEL
Antibody Screen: NEGATIVE
Basophils Absolute: 0 10*3/uL (ref 0.0–0.1)
Basophils Relative: 0 % (ref 0–1)
Eosinophils Absolute: 0.1 10*3/uL (ref 0.0–0.7)
Eosinophils Relative: 1 % (ref 0–5)
HCT: 35.7 % — ABNORMAL LOW (ref 36.0–46.0)
HEP B S AG: NEGATIVE
Hemoglobin: 11.8 g/dL — ABNORMAL LOW (ref 12.0–15.0)
LYMPHS ABS: 2 10*3/uL (ref 0.7–4.0)
LYMPHS PCT: 19 % (ref 12–46)
MCH: 25.9 pg — ABNORMAL LOW (ref 26.0–34.0)
MCHC: 33.1 g/dL (ref 30.0–36.0)
MCV: 78.3 fL (ref 78.0–100.0)
Monocytes Absolute: 0.5 10*3/uL (ref 0.1–1.0)
Monocytes Relative: 5 % (ref 3–12)
NEUTROS ABS: 8 10*3/uL — AB (ref 1.7–7.7)
NEUTROS PCT: 75 % (ref 43–77)
PLATELETS: 344 10*3/uL (ref 150–400)
RBC: 4.56 MIL/uL (ref 3.87–5.11)
RDW: 15.8 % — AB (ref 11.5–15.5)
RUBELLA: 2.59 {index} — AB (ref <0.90)
Rh Type: POSITIVE
WBC: 10.6 10*3/uL — AB (ref 4.0–10.5)

## 2013-12-08 LAB — LUPUS ANTICOAGULANT PANEL
DRVVT: 26.9 s (ref <42.9)
Lupus Anticoagulant: NOT DETECTED
PTT Lupus Anticoagulant: 31.1 secs (ref 28.0–43.0)

## 2013-12-08 LAB — BETA-2 GLYCOPROTEIN ANTIBODIES
BETA-2-GLYCOPROTEIN I IGA: 6 A Units (ref <20)
Beta-2 Glyco I IgG: 11 G Units (ref <20)
Beta-2-Glycoprotein I IgM: 4 M Units (ref <20)

## 2013-12-08 LAB — VARICELLA ZOSTER ANTIBODY, IGG: Varicella IgG: 18.55 Index (ref <135.00)

## 2013-12-08 LAB — VITAMIN D 25 HYDROXY (VIT D DEFICIENCY, FRACTURES): VIT D 25 HYDROXY: 13 ng/mL — AB (ref 30–89)

## 2013-12-08 LAB — CULTURE, OB URINE
Colony Count: NO GROWTH
Organism ID, Bacteria: NO GROWTH

## 2013-12-11 LAB — HEMOGLOBINOPATHY EVALUATION
HGB F QUANT: 0 % (ref 0.0–2.0)
HGB S QUANTITAION: 0 %
Hemoglobin Other: 0 %
Hgb A2 Quant: 2.4 % (ref 2.2–3.2)
Hgb A: 97.6 % (ref 96.8–97.8)

## 2013-12-12 ENCOUNTER — Encounter: Payer: Self-pay | Admitting: Obstetrics & Gynecology

## 2013-12-12 DIAGNOSIS — E559 Vitamin D deficiency, unspecified: Secondary | ICD-10-CM | POA: Insufficient documentation

## 2013-12-12 NOTE — Progress Notes (Signed)
Quick Note:  Needs PNV w/vitamin D ______ 

## 2013-12-14 ENCOUNTER — Encounter: Payer: Self-pay | Admitting: Obstetrics & Gynecology

## 2013-12-14 ENCOUNTER — Ambulatory Visit (INDEPENDENT_AMBULATORY_CARE_PROVIDER_SITE_OTHER): Payer: 59 | Admitting: Obstetrics & Gynecology

## 2013-12-14 VITALS — BP 133/75 | Temp 99.1°F | Wt 216.0 lb

## 2013-12-14 DIAGNOSIS — Z3480 Encounter for supervision of other normal pregnancy, unspecified trimester: Secondary | ICD-10-CM

## 2013-12-14 LAB — POCT URINALYSIS DIPSTICK
Bilirubin, UA: NEGATIVE
Blood, UA: NEGATIVE
Glucose, UA: NEGATIVE
KETONES UA: NEGATIVE
Nitrite, UA: NEGATIVE
PROTEIN UA: NEGATIVE
Spec Grav, UA: <=1.005
Urobilinogen, UA: NEGATIVE
pH, UA: 7

## 2013-12-14 NOTE — Progress Notes (Signed)
Pulse: 71  Patient states she is having back pain. Patient denies any concerns.

## 2013-12-19 ENCOUNTER — Other Ambulatory Visit: Payer: Self-pay | Admitting: *Deleted

## 2013-12-19 DIAGNOSIS — O365990 Maternal care for other known or suspected poor fetal growth, unspecified trimester, not applicable or unspecified: Secondary | ICD-10-CM

## 2013-12-20 ENCOUNTER — Other Ambulatory Visit: Payer: Self-pay | Admitting: Obstetrics & Gynecology

## 2013-12-20 ENCOUNTER — Ambulatory Visit (INDEPENDENT_AMBULATORY_CARE_PROVIDER_SITE_OTHER): Payer: 59 | Admitting: Obstetrics & Gynecology

## 2013-12-20 ENCOUNTER — Encounter: Payer: Self-pay | Admitting: Obstetrics & Gynecology

## 2013-12-20 ENCOUNTER — Ambulatory Visit (INDEPENDENT_AMBULATORY_CARE_PROVIDER_SITE_OTHER): Payer: 59

## 2013-12-20 VITALS — BP 138/109 | Temp 98.1°F | Wt 218.0 lb

## 2013-12-20 DIAGNOSIS — O3680X0 Pregnancy with inconclusive fetal viability, not applicable or unspecified: Secondary | ICD-10-CM

## 2013-12-20 DIAGNOSIS — E669 Obesity, unspecified: Secondary | ICD-10-CM

## 2013-12-20 DIAGNOSIS — O365990 Maternal care for other known or suspected poor fetal growth, unspecified trimester, not applicable or unspecified: Secondary | ICD-10-CM

## 2013-12-20 DIAGNOSIS — Z3400 Encounter for supervision of normal first pregnancy, unspecified trimester: Secondary | ICD-10-CM

## 2013-12-20 DIAGNOSIS — O99210 Obesity complicating pregnancy, unspecified trimester: Secondary | ICD-10-CM

## 2013-12-20 LAB — US OB DETAIL + 14 WK

## 2013-12-20 LAB — POCT URINALYSIS DIPSTICK
BILIRUBIN UA: NEGATIVE
Blood, UA: NEGATIVE
Glucose, UA: NEGATIVE
Ketones, UA: NEGATIVE
LEUKOCYTES UA: NEGATIVE
NITRITE UA: NEGATIVE
PH UA: 5
Spec Grav, UA: 1.015
UROBILINOGEN UA: NEGATIVE

## 2013-12-20 NOTE — Patient Instructions (Signed)
CF Gene Mutation Testing This is a test used to detect cystic fibrosis (CF) genetic mutations to establish CF carrier status or to establish the diagnosis of CF in an individual. The CF gene mutation test identifies mutations in the CFTR gene on chromosome 7. Each cell in the human body (except sperm and eggs) has 46 chromosomes (23 inherited from the mother and 23 from the father). Genes on these chromosomes form the body's blueprint for producing proteins that control body functions. Cystic fibrosis is caused by a mutation in a pair of genes located on chromosomes 7. Both copies of this gene must be abnormal to cause CF. If only one copy of the gene pair is mutated, the patient will be a carrier. Carriers are not ill, they do not have any symptoms, but they can pass their abnormal CF gene copy on to their children.  When a newborn infant has meconium ileus (no stools in the first 24 to 48 hours of life) or when a person has symptoms of CF (salty sweat, persistent respiratory infections, wheezing, persistent diarrhea, foul-smelling greasy stools, malnutrition, and vitamin deficiency); if a person has a positive sweat chloride or IRT test or a close relative who has been diagnosed with CF; when a patient is undergoing genetic counseling and wants to find out if they are a CF carrier; or for prenatal diagnosis. PREPARATION FOR TEST A blood sample drawn from an infant's heel; a spot of blood that is put onto filter paper; or a blood sample is drawn from a vein in the arm. NORMAL FINDINGS No genetic mutation. Ranges for normal findings may vary among different laboratories and hospitals. You should always check with your doctor after having lab work or other tests done to discuss the meaning of your test results and whether your values are considered within normal limits. MEANING OF TEST Your caregiver will go over the test results with you and discuss the importance and meaning of your results, as well as  treatment options and the need for additional tests if necessary. OBTAINING THE TEST RESULTS It is your responsibility to obtain your test results. Ask the lab or department performing the test when and how you will get your results. Document Released: 11/26/2004 Document Revised: 01/25/2012 Document Reviewed: 10/10/2008 [facility] Patient Information 2014 [place], [place].

## 2013-12-20 NOTE — Progress Notes (Signed)
Pulse: 83 Patient denies any concerns.   U/S today w/viable IUP.

## 2013-12-21 ENCOUNTER — Encounter: Payer: 59 | Admitting: Obstetrics & Gynecology

## 2013-12-28 ENCOUNTER — Ambulatory Visit (INDEPENDENT_AMBULATORY_CARE_PROVIDER_SITE_OTHER): Payer: 59 | Admitting: Obstetrics & Gynecology

## 2013-12-28 VITALS — BP 118/72 | Temp 98.9°F | Wt 219.0 lb

## 2013-12-28 DIAGNOSIS — Z3400 Encounter for supervision of normal first pregnancy, unspecified trimester: Secondary | ICD-10-CM

## 2013-12-28 LAB — POCT URINALYSIS DIPSTICK
Blood, UA: NEGATIVE
GLUCOSE UA: NEGATIVE
Ketones, UA: NEGATIVE
Leukocytes, UA: NEGATIVE
Nitrite, UA: NEGATIVE
Protein, UA: NEGATIVE
SPEC GRAV UA: 1.015
pH, UA: 6

## 2013-12-28 NOTE — Progress Notes (Signed)
Pulse: 64  Patient denies any concerns. Cardiac activity on U/S.

## 2014-01-04 ENCOUNTER — Encounter: Payer: 59 | Admitting: Obstetrics & Gynecology

## 2014-01-08 ENCOUNTER — Ambulatory Visit (INDEPENDENT_AMBULATORY_CARE_PROVIDER_SITE_OTHER): Payer: 59 | Admitting: Obstetrics & Gynecology

## 2014-01-08 VITALS — BP 125/79 | Temp 97.8°F | Wt 219.0 lb

## 2014-01-08 DIAGNOSIS — Z3400 Encounter for supervision of normal first pregnancy, unspecified trimester: Secondary | ICD-10-CM

## 2014-01-08 LAB — POCT URINALYSIS DIPSTICK
Bilirubin, UA: NEGATIVE
Blood, UA: NEGATIVE
Glucose, UA: NEGATIVE
KETONES UA: NEGATIVE
Leukocytes, UA: NEGATIVE
Nitrite, UA: NEGATIVE
Protein, UA: NEGATIVE
SPEC GRAV UA: 1.01
Urobilinogen, UA: NEGATIVE
pH, UA: 7

## 2014-01-08 NOTE — Progress Notes (Signed)
Pulse: 74  Patient denies any concerns. Cardiac activity on U/S.

## 2014-01-15 ENCOUNTER — Encounter: Payer: 59 | Admitting: Obstetrics & Gynecology

## 2014-01-17 ENCOUNTER — Ambulatory Visit (INDEPENDENT_AMBULATORY_CARE_PROVIDER_SITE_OTHER): Payer: 59 | Admitting: Obstetrics & Gynecology

## 2014-01-17 ENCOUNTER — Encounter: Payer: 59 | Admitting: Obstetrics & Gynecology

## 2014-01-17 VITALS — BP 130/78 | Wt 214.0 lb

## 2014-01-17 DIAGNOSIS — Z3480 Encounter for supervision of other normal pregnancy, unspecified trimester: Secondary | ICD-10-CM

## 2014-01-17 LAB — POCT URINALYSIS DIPSTICK
BILIRUBIN UA: NEGATIVE
Blood, UA: NEGATIVE
Glucose, UA: NEGATIVE
KETONES UA: NEGATIVE
LEUKOCYTES UA: NEGATIVE
Nitrite, UA: NEGATIVE
PH UA: 7
Protein, UA: NEGATIVE
SPEC GRAV UA: 1.01
Urobilinogen, UA: NEGATIVE

## 2014-01-17 MED ORDER — OB COMPLETE PETITE 35-5-1-200 MG PO CAPS: 1.0000 | ORAL_CAPSULE | Freq: Every day | ORAL | Status: AC

## 2014-01-17 NOTE — Progress Notes (Signed)
Pulse 75 Pt states that she doesn't have much of an appetite and has weight loss at today's visit.  Pt states that she is able to tolerate fluids.  Pt states that she is concerned about this.  Pt states she is also having some constipation.  Pt advised to try stool softener.  Pt lab results discussed, low Vitamin D.  Rx for prenatal,OB Complete petite, sent to pharmacy.

## 2014-01-18 ENCOUNTER — Encounter: Payer: 59 | Admitting: Obstetrics & Gynecology

## 2014-01-20 NOTE — Patient Instructions (Signed)

## 2014-01-25 ENCOUNTER — Ambulatory Visit (INDEPENDENT_AMBULATORY_CARE_PROVIDER_SITE_OTHER): Payer: 59 | Admitting: Obstetrics & Gynecology

## 2014-01-25 VITALS — BP 129/71 | Temp 99.2°F | Wt 216.0 lb

## 2014-01-25 DIAGNOSIS — Z3400 Encounter for supervision of normal first pregnancy, unspecified trimester: Secondary | ICD-10-CM

## 2014-01-25 LAB — POCT URINALYSIS DIPSTICK
BILIRUBIN UA: NEGATIVE
KETONES UA: NEGATIVE
LEUKOCYTES UA: NEGATIVE
Nitrite, UA: NEGATIVE
RBC UA: NEGATIVE
Spec Grav, UA: 1.02
Urobilinogen, UA: NEGATIVE
pH, UA: 6

## 2014-01-25 NOTE — Patient Instructions (Signed)
Second Trimester of Pregnancy The second trimester is from week 13 through week 28, months 4 through 6. The second trimester is often a time when you feel your best. Your body has also adjusted to being pregnant, and you begin to feel better physically. Usually, morning sickness has lessened or quit completely, you may have more energy, and you may have an increase in appetite. The second trimester is also a time when the fetus is growing rapidly. At the end of the sixth month, the fetus is about 9 inches long and weighs about 1 pounds. You will likely begin to feel the baby move (quickening) between 18 and 20 weeks of the pregnancy. BODY CHANGES Your body goes through many changes during pregnancy. The changes vary from woman to woman.   Your weight will continue to increase. You will notice your lower abdomen bulging out.  You may begin to get stretch marks on your hips, abdomen, and breasts.  You may develop headaches that can be relieved by medicines approved by your caregiver.  You may urinate more often because the fetus is pressing on your bladder.  You may develop or continue to have heartburn as a result of your pregnancy.  You may develop constipation because certain hormones are causing the muscles that push waste through your intestines to slow down.  You may develop hemorrhoids or swollen, bulging veins (varicose veins).  You may have back pain because of the weight gain and pregnancy hormones relaxing your joints between the bones in your pelvis and as a result of a shift in weight and the muscles that support your balance.  Your breasts will continue to grow and be tender.  Your gums may bleed and may be sensitive to brushing and flossing.  Dark spots or blotches (chloasma, mask of pregnancy) may develop on your face. This will likely fade after the baby is born.  A dark line from your belly button to the pubic area (linea nigra) may appear. This will likely fade after the  baby is born. WHAT TO EXPECT AT YOUR PRENATAL VISITS During a routine prenatal visit:  You will be weighed to make sure you and the fetus are growing normally.  Your blood pressure will be taken.  Your abdomen will be measured to track your baby's growth.  The fetal heartbeat will be listened to.  Any test results from the previous visit will be discussed. Your caregiver may ask you:  How you are feeling.  If you are feeling the baby move.  If you have had any abnormal symptoms, such as leaking fluid, bleeding, severe headaches, or abdominal cramping.  If you have any questions. Other tests that may be performed during your second trimester include:  Blood tests that check for:  Low iron levels (anemia).  Gestational diabetes (between 24 and 28 weeks).  Rh antibodies.  Urine tests to check for infections, diabetes, or protein in the urine.  An ultrasound to confirm the proper growth and development of the baby.  An amniocentesis to check for possible genetic problems.  Fetal screens for spina bifida and Down syndrome. HOME CARE INSTRUCTIONS   Avoid all smoking, herbs, alcohol, and unprescribed drugs. These chemicals affect the formation and growth of the baby.  Follow your caregiver's instructions regarding medicine use. There are medicines that are either safe or unsafe to take during pregnancy.  Exercise only as directed by your caregiver. Experiencing uterine cramps is a good sign to stop exercising.  Continue to eat regular,   healthy meals.  Wear a good support bra for breast tenderness.  Do not use hot tubs, steam rooms, or saunas.  Wear your seat belt at all times when driving.  Avoid raw meat, uncooked cheese, cat litter boxes, and soil used by cats. These carry germs that can cause birth defects in the baby.  Take your prenatal vitamins.  Try taking a stool softener (if your caregiver approves) if you develop constipation. Eat more high-fiber foods,  such as fresh vegetables or fruit and whole grains. Drink plenty of fluids to keep your urine clear or pale yellow.  Take warm sitz baths to soothe any pain or discomfort caused by hemorrhoids. Use hemorrhoid cream if your caregiver approves.  If you develop varicose veins, wear support hose. Elevate your feet for 15 minutes, 3 4 times a day. Limit salt in your diet.  Avoid heavy lifting, wear low heel shoes, and practice good posture.  Rest with your legs elevated if you have leg cramps or low back pain.  Visit your dentist if you have not gone yet during your pregnancy. Use a soft toothbrush to brush your teeth and be gentle when you floss.  A sexual relationship may be continued unless your caregiver directs you otherwise.  Continue to go to all your prenatal visits as directed by your caregiver. SEEK MEDICAL CARE IF:   You have dizziness.  You have mild pelvic cramps, pelvic pressure, or nagging pain in the abdominal area.  You have persistent nausea, vomiting, or diarrhea.  You have a bad smelling vaginal discharge.  You have pain with urination. SEEK IMMEDIATE MEDICAL CARE IF:   You have a fever.  You are leaking fluid from your vagina.  You have spotting or bleeding from your vagina.  You have severe abdominal cramping or pain.  You have rapid weight gain or loss.  You have shortness of breath with chest pain.  You notice sudden or extreme swelling of your face, hands, ankles, feet, or legs.  You have not felt your baby move in over an hour.  You have severe headaches that do not go away with medicine.  You have vision changes. Document Released: 10/27/2001 Document Revised: 07/05/2013 Document Reviewed: 01/03/2013 [employer] Patient Information 2014 [employer].  

## 2014-01-25 NOTE — Progress Notes (Signed)
Pulse: 61 Patient denies any concerns. Cardiac activity on U/S.

## 2014-01-28 ENCOUNTER — Inpatient Hospital Stay (HOSPITAL_COMMUNITY)
Admission: AD | Admit: 2014-01-28 | Discharge: 2014-01-28 | Disposition: A | Discharge status: Home / Self Care | Payer: 59 | Source: Ambulatory Visit | Attending: Obstetrics | Admitting: Obstetrics

## 2014-01-28 ENCOUNTER — Encounter (HOSPITAL_COMMUNITY): Payer: Self-pay | Admitting: *Deleted

## 2014-01-28 DIAGNOSIS — N760 Acute vaginitis: Secondary | ICD-10-CM | POA: Diagnosis not present

## 2014-01-28 DIAGNOSIS — O2390 Unspecified genitourinary tract infection in pregnancy, unspecified trimester: Secondary | ICD-10-CM | POA: Insufficient documentation

## 2014-01-28 DIAGNOSIS — R109 Unspecified abdominal pain: Secondary | ICD-10-CM | POA: Diagnosis not present

## 2014-01-28 DIAGNOSIS — N83209 Unspecified ovarian cyst, unspecified side: Secondary | ICD-10-CM | POA: Diagnosis not present

## 2014-01-28 DIAGNOSIS — A499 Bacterial infection, unspecified: Secondary | ICD-10-CM

## 2014-01-28 DIAGNOSIS — K5900 Constipation, unspecified: Secondary | ICD-10-CM | POA: Insufficient documentation

## 2014-01-28 DIAGNOSIS — B9689 Other specified bacterial agents as the cause of diseases classified elsewhere: Secondary | ICD-10-CM | POA: Diagnosis not present

## 2014-01-28 LAB — URINALYSIS, ROUTINE W REFLEX MICROSCOPIC
Bilirubin Urine: NEGATIVE
Glucose, UA: NEGATIVE mg/dL
Hgb urine dipstick: NEGATIVE
Ketones, ur: NEGATIVE mg/dL
Leukocytes, UA: NEGATIVE
NITRITE: NEGATIVE
Protein, ur: NEGATIVE mg/dL
SPECIFIC GRAVITY, URINE: 1.01 (ref 1.005–1.030)
Urobilinogen, UA: 1 mg/dL (ref 0.0–1.0)
pH: 7 (ref 5.0–8.0)

## 2014-01-28 LAB — WET PREP, GENITAL
Trich, Wet Prep: NONE SEEN
Yeast Wet Prep HPF POC: NONE SEEN

## 2014-01-28 MED ORDER — METRONIDAZOLE 500 MG PO TABS: 500.0000 mg | ORAL_TABLET | Freq: Two times a day (BID) | ORAL | Status: DC

## 2014-01-28 NOTE — MAU Provider Note (Signed)
History     CSN: [id number]  Arrival date and time: 01/28/14 1326   First Provider Initiated Contact with Patient 01/28/14 1434      Chief Complaint  Patient presents with  . Abdominal Cramping   HPI Lynn Holland is a 23 y.o. G3P0020 at 2741w4d. She started having low abd cramping last night, mostly RLQ. She has an ovarian cyst, is worried is growing. She also c/o rectal pain, esp with sitting. Has been constipated, sm BM last night. No changes in discharge, odor or itching. No UTI S&S. Still has nausea. No bleeding or spotting, no fever/chills.  OB History   Grav Para Term Preterm Abortions TAB SAB Ect Mult Living   3    2  2    0      Past Medical History  Diagnosis Date  . Medical history non-contributory     Past Surgical History  Procedure Laterality Date  . No past surgeries      Family History  Problem Relation Age of Onset  . Hypertension Father     History  Substance Use Topics  . Smoking status: Never Smoker   . Smokeless tobacco: Never Used  . Alcohol Use: No    Allergies: No Known Allergies  Prescriptions prior to admission  Medication Sig Dispense Refill  . ibuprofen (ADVIL,MOTRIN) 200 MG tablet Take 400 mg by mouth every 6 (six) hours as needed for headache.      . Prenat-FeCbn-FeAspGl-FA-Omega (OB COMPLETE PETITE) 35-5-1-200 MG CAPS Take 1 capsule by mouth daily.  30 capsule  11    Review of Systems  Constitutional: Negative for fever and chills.  Gastrointestinal: Positive for nausea, abdominal pain and constipation. Negative for vomiting and diarrhea.  Genitourinary: Negative for dysuria, urgency and frequency.   Physical Exam   Blood pressure 139/81, pulse 89, temperature 98.1 F (36.7 C), temperature source Oral, resp. rate 18, height 5\' 5"  (1.651 m), weight 212 lb (96.163 kg), last menstrual period 11/01/2013, unknown if currently breastfeeding.  Physical Exam  Nursing note and vitals reviewed. Constitutional: She is oriented to  person, place, and time. She appears well-developed and well-nourished.  GI: Soft. Bowel sounds are normal. She exhibits no distension. There is no tenderness. There is no rebound and no guarding.  Genitourinary:  Pelvic exam: Ext gen- nl anatomy,skin intact Vagina-small amt thin white discharge Cx- closed Uterus- gravid 12-14 wk size, non tender Adn- non tender Rectal- sm amt soft stool  Musculoskeletal: Normal range of motion.  Neurological: She is alert and oriented to person, place, and time.  Skin: Skin is warm and dry.  Psychiatric: She has a normal mood and affect. Her behavior is normal.    MAU Course  Procedures  MDM Results for orders placed during the hospital encounter of 01/28/14 (from the past 24 hour(s))  URINALYSIS, ROUTINE W REFLEX MICROSCOPIC     Status: None   Collection Time    01/28/14  1:50 PM      Result Value Ref Range   Color, Urine YELLOW  YELLOW   APPearance CLEAR  CLEAR   Specific Gravity, Urine 1.010  1.005 - 1.030   pH 7.0  5.0 - 8.0   Glucose, UA NEGATIVE  NEGATIVE mg/dL   Hgb urine dipstick NEGATIVE  NEGATIVE   Bilirubin Urine NEGATIVE  NEGATIVE   Ketones, ur NEGATIVE  NEGATIVE mg/dL   Protein, ur NEGATIVE  NEGATIVE mg/dL   Urobilinogen, UA 1.0  0.0 - 1.0 mg/dL   Nitrite NEGATIVE  NEGATIVE   Leukocytes, UA NEGATIVE  NEGATIVE  WET PREP, GENITAL     Status: Abnormal   Collection Time    01/28/14  3:15 PM      Result Value Ref Range   Yeast Wet Prep HPF POC NONE SEEN  NONE SEEN   Trich, Wet Prep NONE SEEN  NONE SEEN   Clue Cells Wet Prep HPF POC MANY (*) NONE SEEN   WBC, Wet Prep HPF POC MODERATE (*) NONE SEEN   U/S reviewed from 12/20/13- no ovarian cyst  Assessment and Plan  12 4/7 wks IUP Pt informed ovarian cyst as resolved BV   Flagyl po Management of constipation reviewed Keep next prenatal visit 4/16 Call the office with other changes or concerns  Dagmawi Venable M. 01/28/2014, 2:44 PM

## 2014-01-28 NOTE — MAU Note (Signed)
Pt presents with complaints of abdominal cramping and rectal pain that started last night. She says that she was told had a cyst on her right ovary and she is concerned about the abdominal pain and rectal pain.

## 2014-01-28 NOTE — Discharge Instructions (Signed)

## 2014-02-15 ENCOUNTER — Ambulatory Visit (INDEPENDENT_AMBULATORY_CARE_PROVIDER_SITE_OTHER): Payer: 59 | Admitting: Obstetrics & Gynecology

## 2014-02-15 ENCOUNTER — Encounter: Payer: Self-pay | Admitting: *Deleted

## 2014-02-15 VITALS — BP 132/75 | Temp 98.3°F | Wt 205.0 lb

## 2014-02-15 DIAGNOSIS — Z3480 Encounter for supervision of other normal pregnancy, unspecified trimester: Secondary | ICD-10-CM

## 2014-02-15 LAB — POCT URINALYSIS DIPSTICK
Bilirubin, UA: NEGATIVE
Blood, UA: NEGATIVE
Glucose, UA: NEGATIVE
KETONES UA: NEGATIVE
Nitrite, UA: NEGATIVE
PH UA: 6
PROTEIN UA: NEGATIVE
SPEC GRAV UA: 1.015
UROBILINOGEN UA: NEGATIVE

## 2014-02-15 NOTE — Progress Notes (Signed)
Pulse 66  Pt states that she has been having some swelling and pain in feet.  Pt states that she is also having some lower left side pain that radiate up her side. Pt states she had been told she had an ovarian cyst more that 2 weeks ago.  Pt denies any urinary problems.

## 2014-02-16 NOTE — Patient Instructions (Signed)
Second Trimester of Pregnancy The second trimester is from week 13 through week 28, months 4 through 6. The second trimester is often a time when you feel your best. Your body has also adjusted to being pregnant, and you begin to feel better physically. Usually, morning sickness has lessened or quit completely, you may have more energy, and you may have an increase in appetite. The second trimester is also a time when the fetus is growing rapidly. At the end of the sixth month, the fetus is about 9 inches long and weighs about 1 pounds. You will likely begin to feel the baby move (quickening) between 18 and 20 weeks of the pregnancy. BODY CHANGES Your body goes through many changes during pregnancy. The changes vary from woman to woman.   Your weight will continue to increase. You will notice your lower abdomen bulging out.  You may begin to get stretch marks on your hips, abdomen, and breasts.  You may develop headaches that can be relieved by medicines approved by your caregiver.  You may urinate more often because the fetus is pressing on your bladder.  You may develop or continue to have heartburn as a result of your pregnancy.  You may develop constipation because certain hormones are causing the muscles that push waste through your intestines to slow down.  You may develop hemorrhoids or swollen, bulging veins (varicose veins).  You may have back pain because of the weight gain and pregnancy hormones relaxing your joints between the bones in your pelvis and as a result of a shift in weight and the muscles that support your balance.  Your breasts will continue to grow and be tender.  Your gums may bleed and may be sensitive to brushing and flossing.  Dark spots or blotches (chloasma, mask of pregnancy) may develop on your face. This will likely fade after the baby is born.  A dark line from your belly button to the pubic area (linea nigra) may appear. This will likely fade after the  baby is born. WHAT TO EXPECT AT YOUR PRENATAL VISITS During a routine prenatal visit:  You will be weighed to make sure you and the fetus are growing normally.  Your blood pressure will be taken.  Your abdomen will be measured to track your baby's growth.  The fetal heartbeat will be listened to.  Any test results from the previous visit will be discussed. Your caregiver may ask you:  How you are feeling.  If you are feeling the baby move.  If you have had any abnormal symptoms, such as leaking fluid, bleeding, severe headaches, or abdominal cramping.  If you have any questions. Other tests that may be performed during your second trimester include:  Blood tests that check for:  Low iron levels (anemia).  Gestational diabetes (between 24 and 28 weeks).  Rh antibodies.  Urine tests to check for infections, diabetes, or protein in the urine.  An ultrasound to confirm the proper growth and development of the baby.  An amniocentesis to check for possible genetic problems.  Fetal screens for spina bifida and Down syndrome. HOME CARE INSTRUCTIONS   Avoid all smoking, herbs, alcohol, and unprescribed drugs. These chemicals affect the formation and growth of the baby.  Follow your caregiver's instructions regarding medicine use. There are medicines that are either safe or unsafe to take during pregnancy.  Exercise only as directed by your caregiver. Experiencing uterine cramps is a good sign to stop exercising.  Continue to eat regular,   healthy meals.  Wear a good support bra for breast tenderness.  Do not use hot tubs, steam rooms, or saunas.  Wear your seat belt at all times when driving.  Avoid raw meat, uncooked cheese, cat litter boxes, and soil used by cats. These carry germs that can cause birth defects in the baby.  Take your prenatal vitamins.  Try taking a stool softener (if your caregiver approves) if you develop constipation. Eat more high-fiber foods,  such as fresh vegetables or fruit and whole grains. Drink plenty of fluids to keep your urine clear or pale yellow.  Take warm sitz baths to soothe any pain or discomfort caused by hemorrhoids. Use hemorrhoid cream if your caregiver approves.  If you develop varicose veins, wear support hose. Elevate your feet for 15 minutes, 3 4 times a day. Limit salt in your diet.  Avoid heavy lifting, wear low heel shoes, and practice good posture.  Rest with your legs elevated if you have leg cramps or low back pain.  Visit your dentist if you have not gone yet during your pregnancy. Use a soft toothbrush to brush your teeth and be gentle when you floss.  A sexual relationship may be continued unless your caregiver directs you otherwise.  Continue to go to all your prenatal visits as directed by your caregiver. SEEK MEDICAL CARE IF:   You have dizziness.  You have mild pelvic cramps, pelvic pressure, or nagging pain in the abdominal area.  You have persistent nausea, vomiting, or diarrhea.  You have a bad smelling vaginal discharge.  You have pain with urination. SEEK IMMEDIATE MEDICAL CARE IF:   You have a fever.  You are leaking fluid from your vagina.  You have spotting or bleeding from your vagina.  You have severe abdominal cramping or pain.  You have rapid weight gain or loss.  You have shortness of breath with chest pain.  You notice sudden or extreme swelling of your face, hands, ankles, feet, or legs.  You have not felt your baby move in over an hour.  You have severe headaches that do not go away with medicine.  You have vision changes. Document Released: 10/27/2001 Document Revised: 07/05/2013 Document Reviewed: 01/03/2013 [employer] Patient Information 2014 [employer].  

## 2014-02-28 ENCOUNTER — Inpatient Hospital Stay (HOSPITAL_COMMUNITY)
Admission: AD | Admit: 2014-02-28 | Discharge: 2014-02-28 | Disposition: A | Discharge status: Home / Self Care | Payer: 59 | Source: Ambulatory Visit | Attending: Obstetrics & Gynecology | Admitting: Obstetrics & Gynecology

## 2014-02-28 ENCOUNTER — Encounter (HOSPITAL_COMMUNITY): Payer: Self-pay | Admitting: General Practice

## 2014-02-28 DIAGNOSIS — B373 Candidiasis of vulva and vagina: Secondary | ICD-10-CM

## 2014-02-28 DIAGNOSIS — Z3400 Encounter for supervision of normal first pregnancy, unspecified trimester: Secondary | ICD-10-CM | POA: Diagnosis not present

## 2014-02-28 DIAGNOSIS — O2620 Pregnancy care for patient with recurrent pregnancy loss, unspecified trimester: Secondary | ICD-10-CM

## 2014-02-28 DIAGNOSIS — R109 Unspecified abdominal pain: Secondary | ICD-10-CM | POA: Insufficient documentation

## 2014-02-28 DIAGNOSIS — E559 Vitamin D deficiency, unspecified: Secondary | ICD-10-CM

## 2014-02-28 DIAGNOSIS — O2390 Unspecified genitourinary tract infection in pregnancy, unspecified trimester: Secondary | ICD-10-CM | POA: Insufficient documentation

## 2014-02-28 DIAGNOSIS — L293 Anogenital pruritus, unspecified: Secondary | ICD-10-CM | POA: Insufficient documentation

## 2014-02-28 DIAGNOSIS — B3731 Acute candidiasis of vulva and vagina: Secondary | ICD-10-CM | POA: Insufficient documentation

## 2014-02-28 LAB — WET PREP, GENITAL
Clue Cells Wet Prep HPF POC: NONE SEEN
Trich, Wet Prep: NONE SEEN

## 2014-02-28 LAB — URINALYSIS, ROUTINE W REFLEX MICROSCOPIC
BILIRUBIN URINE: NEGATIVE
GLUCOSE, UA: NEGATIVE mg/dL
Hgb urine dipstick: NEGATIVE
KETONES UR: NEGATIVE mg/dL
Leukocytes, UA: NEGATIVE
Nitrite: NEGATIVE
Protein, ur: NEGATIVE mg/dL
Specific Gravity, Urine: 1.02 (ref 1.005–1.030)
Urobilinogen, UA: 1 mg/dL (ref 0.0–1.0)
pH: 6.5 (ref 5.0–8.0)

## 2014-02-28 MED ORDER — FLUCONAZOLE 150 MG PO TABS: 150.0000 mg | ORAL_TABLET | Freq: Once | ORAL | Status: AC

## 2014-02-28 NOTE — Discharge Instructions (Signed)
Monilial Vaginitis  Vaginitis in a soreness, swelling and redness (inflammation) of the vagina and vulva. Monilial vaginitis is not a sexually transmitted infection.  CAUSES   Yeast vaginitis is caused by yeast (candida) that is normally found in your vagina. With a yeast infection, the candida has overgrown in number to a point that upsets the chemical balance.  SYMPTOMS   · White, thick vaginal discharge.  · Swelling, itching, redness and irritation of the vagina and possibly the lips of the vagina (vulva).  · Burning or painful urination.  · Painful intercourse.  DIAGNOSIS   Things that may contribute to monilial vaginitis are:  · Postmenopausal and virginal states.  · Pregnancy.  · Infections.  · Being tired, sick or stressed, especially if you had monilial vaginitis in the past.  · Diabetes. Good control will help lower the chance.  · Birth control pills.  · Tight fitting garments.  · Using bubble bath, feminine sprays, douches or deodorant tampons.  · Taking certain medications that kill germs (antibiotics).  · Sporadic recurrence can occur if you become ill.  TREATMENT   Your caregiver will give you medication.  · There are several kinds of anti monilial vaginal creams and suppositories specific for monilial vaginitis. For recurrent yeast infections, use a suppository or cream in the vagina 2 times a week, or as directed.  · Anti-monilial or steroid cream for the itching or irritation of the vulva may also be used. Get your caregiver's permission.  · Painting the vagina with methylene blue solution may help if the monilial cream does not work.  · Eating yogurt may help prevent monilial vaginitis.  HOME CARE INSTRUCTIONS   · Finish all medication as prescribed.  · Do not have sex until treatment is completed or after your caregiver tells you it is okay.  · Take warm sitz baths.  · Do not douche.  · Do not use tampons, especially scented ones.  · Wear cotton underwear.  · Avoid tight pants and panty  hose.  · Tell your sexual partner that you have a yeast infection. They should go to their caregiver if they have symptoms such as mild rash or itching.  · Your sexual partner should be treated as well if your infection is difficult to eliminate.  · Practice safer sex. Use condoms.  · Some vaginal medications cause latex condoms to fail. Vaginal medications that harm condoms are:  · Cleocin cream.  · Butoconazole (Femstat®).  · Terconazole (Terazol®) vaginal suppository.  · Miconazole (Monistat®) (may be purchased over the counter).  SEEK MEDICAL CARE IF:   · You have a temperature by mouth above 102° F (38.9° C).  · The infection is getting worse after 2 days of treatment.  · The infection is not getting better after 3 days of treatment.  · You develop blisters in or around your vagina.  · You develop vaginal bleeding, and it is not your menstrual period.  · You have pain when you urinate.  · You develop intestinal problems.  · You have pain with sexual intercourse.  Document Released: 08/12/2005 Document Revised: 01/25/2012 Document Reviewed: 04/26/2009  ExitCare® Patient Information ©2014 [employer].

## 2014-02-28 NOTE — MAU Note (Signed)
Vag itching and abd cramping started today.

## 2014-02-28 NOTE — MAU Provider Note (Signed)
Chief Complaint: Vaginal Itching and Abdominal Cramping   First Provider Initiated Contact with Patient 02/28/14 1928     SUBJECTIVE HPI: Lynn Holland Lynn Holland is a 23 y.o. G3P0020 at 5574w0d by LMP who presents with clumpy white discharge and vaginal pruritis that began today. Also some lower abdominal intermittent crampy.  Denies dysuria, hematuria, frequency or urgency of urination. No vaginal bleeding; no upper abdominal pain.  [facility]   Past Medical History  Diagnosis Date  . Medical history non-contributory    OB History  Gravida Para Term Preterm AB SAB TAB Ectopic Multiple Living  3    2 2     0    # Outcome Date GA Lbr Len/2nd Weight Sex Delivery Anes PTL Lv  3 CUR           2 SAB 2014 5866w0d         1 SAB 2013 3720w0d            Past Surgical History  Procedure Laterality Date  . No past surgeries     History   Social History  . Marital Status: Single    Spouse Name: N/A    Number of Children: N/A  . Years of Education: N/A   Occupational History  . Not on file.   Social History Main Topics  . Smoking status: Never Smoker   . Smokeless tobacco: Never Used  . Alcohol Use: No  . Drug Use: No  . Sexual Activity: Yes    Birth Control/ Protection: None   Other Topics Concern  . Not on file   Social History Narrative  . No narrative on file   No current facility-administered medications on file prior to encounter.   Current Outpatient Prescriptions on File Prior to Encounter  Medication Sig Dispense Refill  . Prenat-FeCbn-FeAspGl-FA-Omega (OB COMPLETE PETITE) 35-5-1-200 MG CAPS Take 1 capsule by mouth daily.  30 capsule  11   No Known Allergies  ROS: Pertinent items in HPI  OBJECTIVE Blood pressure 130/64, pulse 75, temperature 98 F (36.7 C), temperature source Oral, resp. rate 18, weight 94.348 kg (208 lb), last menstrual period 11/01/2013, unknown if currently breastfeeding. GENERAL: Well-developed, well-nourished female in no acute  distress.  HEENT: Normocephalic HEART: normal rate RESP: normal effort ABDOMEN: Soft, non-tender, S=D. FHR 150s EXTREMITIES: Nontender, no edema NEURO: Alert and oriented SPECULUM EXAM: NEFG,clumpy white discharge removed with swabs, no blood noted, cervix clean BIMANUAL: cervix post, thick, closed; uterus normal size, no adnexal tenderness or masses  LAB RESULTS Results for orders placed during the hospital encounter of 02/28/14 (from the past 24 hour(s))  WET PREP, GENITAL     Status: Abnormal   Collection Time    02/28/14  7:47 PM      Result Value Ref Range   Yeast Wet Prep HPF POC MODERATE (*) NONE SEEN   Trich, Wet Prep NONE SEEN  NONE SEEN   Clue Cells Wet Prep HPF POC NONE SEEN  NONE SEEN   WBC, Wet Prep HPF POC MODERATE (*) NONE SEEN    IMAGING No results found.  MAU COURSE  ASSESSMENT 1. Unspecified vitamin D deficiency   2. Supervision of normal first pregnancy   3. Pregnancy complicated by previous recurrent miscarriages   4. Yeast vaginitis   G3P0020 at 1374w0d  PLAN Discharge home    Medication List         acetaminophen 325 MG tablet  Commonly known as:  TYLENOL  Take by mouth every 6 (  six) hours as needed for headache.     fluconazole 150 MG tablet  Commonly known as:  DIFLUCAN  Take 1 tablet (150 mg total) by mouth once.     OB COMPLETE PETITE 35-5-1-200 MG Caps  Take 1 capsule by mouth daily.       Follow-up Information   [facility]. (Call to reschedule tomorrow's appointment)    Contact information:   [address] [place] [id number] [phone]      Lynn OrleansDeirdre C Nikolina Holland, CNM 02/28/2014  7:33 PM

## 2014-03-01 ENCOUNTER — Encounter: Payer: 59 | Admitting: Obstetrics & Gynecology

## 2014-03-01 ENCOUNTER — Ambulatory Visit (INDEPENDENT_AMBULATORY_CARE_PROVIDER_SITE_OTHER): Payer: 59 | Admitting: Obstetrics & Gynecology

## 2014-03-01 VITALS — BP 118/71 | Temp 98.3°F | Wt 212.0 lb

## 2014-03-01 DIAGNOSIS — Z3400 Encounter for supervision of normal first pregnancy, unspecified trimester: Secondary | ICD-10-CM

## 2014-03-01 LAB — POCT URINALYSIS DIPSTICK
Bilirubin, UA: NEGATIVE
GLUCOSE UA: NEGATIVE
Ketones, UA: NEGATIVE
NITRITE UA: NEGATIVE
RBC UA: NEGATIVE
Spec Grav, UA: 1.02
UROBILINOGEN UA: NEGATIVE
pH, UA: 6

## 2014-03-01 MED ORDER — TERCONAZOLE 0.4 % VA CREA
1.0000 | TOPICAL_CREAM | Freq: Every day | VAGINAL | Status: AC
Start: 2014-03-01 — End: 2014-03-08

## 2014-03-01 NOTE — Progress Notes (Deleted)
Pulse: 63 Patient denies any concerns.

## 2014-03-01 NOTE — Patient Instructions (Signed)
Second Trimester of Pregnancy The second trimester is from week 13 through week 28, months 4 through 6. The second trimester is often a time when you feel your best. Your body has also adjusted to being pregnant, and you begin to feel better physically. Usually, morning sickness has lessened or quit completely, you may have more energy, and you may have an increase in appetite. The second trimester is also a time when the fetus is growing rapidly. At the end of the sixth month, the fetus is about 9 inches long and weighs about 1 pounds. You will likely begin to feel the baby move (quickening) between 18 and 20 weeks of the pregnancy. BODY CHANGES Your body goes through many changes during pregnancy. The changes vary from woman to woman.   Your weight will continue to increase. You will notice your lower abdomen bulging out.  You may begin to get stretch marks on your hips, abdomen, and breasts.  You may develop headaches that can be relieved by medicines approved by your caregiver.  You may urinate more often because the fetus is pressing on your bladder.  You may develop or continue to have heartburn as a result of your pregnancy.  You may develop constipation because certain hormones are causing the muscles that push waste through your intestines to slow down.  You may develop hemorrhoids or swollen, bulging veins (varicose veins).  You may have back pain because of the weight gain and pregnancy hormones relaxing your joints between the bones in your pelvis and as a result of a shift in weight and the muscles that support your balance.  Your breasts will continue to grow and be tender.  Your gums may bleed and may be sensitive to brushing and flossing.  Dark spots or blotches (chloasma, mask of pregnancy) may develop on your face. This will likely fade after the baby is born.  A dark line from your belly button to the pubic area (linea nigra) may appear. This will likely fade after the  baby is born. WHAT TO EXPECT AT YOUR PRENATAL VISITS During a routine prenatal visit:  You will be weighed to make sure you and the fetus are growing normally.  Your blood pressure will be taken.  Your abdomen will be measured to track your baby's growth.  The fetal heartbeat will be listened to.  Any test results from the previous visit will be discussed. Your caregiver may ask you:  How you are feeling.  If you are feeling the baby move.  If you have had any abnormal symptoms, such as leaking fluid, bleeding, severe headaches, or abdominal cramping.  If you have any questions. Other tests that may be performed during your second trimester include:  Blood tests that check for:  Low iron levels (anemia).  Gestational diabetes (between 24 and 28 weeks).  Rh antibodies.  Urine tests to check for infections, diabetes, or protein in the urine.  An ultrasound to confirm the proper growth and development of the baby.  An amniocentesis to check for possible genetic problems.  Fetal screens for spina bifida and Down syndrome. HOME CARE INSTRUCTIONS   Avoid all smoking, herbs, alcohol, and unprescribed drugs. These chemicals affect the formation and growth of the baby.  Follow your caregiver's instructions regarding medicine use. There are medicines that are either safe or unsafe to take during pregnancy.  Exercise only as directed by your caregiver. Experiencing uterine cramps is a good sign to stop exercising.  Continue to eat regular,   healthy meals.  Wear a good support bra for breast tenderness.  Do not use hot tubs, steam rooms, or saunas.  Wear your seat belt at all times when driving.  Avoid raw meat, uncooked cheese, cat litter boxes, and soil used by cats. These carry germs that can cause birth defects in the baby.  Take your prenatal vitamins.  Try taking a stool softener (if your caregiver approves) if you develop constipation. Eat more high-fiber foods,  such as fresh vegetables or fruit and whole grains. Drink plenty of fluids to keep your urine clear or pale yellow.  Take warm sitz baths to soothe any pain or discomfort caused by hemorrhoids. Use hemorrhoid cream if your caregiver approves.  If you develop varicose veins, wear support hose. Elevate your feet for 15 minutes, 3 4 times a day. Limit salt in your diet.  Avoid heavy lifting, wear low heel shoes, and practice good posture.  Rest with your legs elevated if you have leg cramps or low back pain.  Visit your dentist if you have not gone yet during your pregnancy. Use a soft toothbrush to brush your teeth and be gentle when you floss.  A sexual relationship may be continued unless your caregiver directs you otherwise.  Continue to go to all your prenatal visits as directed by your caregiver. SEEK MEDICAL CARE IF:   You have dizziness.  You have mild pelvic cramps, pelvic pressure, or nagging pain in the abdominal area.  You have persistent nausea, vomiting, or diarrhea.  You have a bad smelling vaginal discharge.  You have pain with urination. SEEK IMMEDIATE MEDICAL CARE IF:   You have a fever.  You are leaking fluid from your vagina.  You have spotting or bleeding from your vagina.  You have severe abdominal cramping or pain.  You have rapid weight gain or loss.  You have shortness of breath with chest pain.  You notice sudden or extreme swelling of your face, hands, ankles, feet, or legs.  You have not felt your baby move in over an hour.  You have severe headaches that do not go away with medicine.  You have vision changes. Document Released: 10/27/2001 Document Revised: 07/05/2013 Document Reviewed: 01/03/2013 [employer] Patient Information 2014 [employer].  

## 2014-03-01 NOTE — Progress Notes (Signed)
Subjective:    [name] is a 23 y.o. female being seen today for her obstetrical visit. She is at 5616w1d gestation. Patient reports no complaints. Fetal movement: none.  Review of Systems:   Review of Systems GU: white vaginal discharge, itching; negative for bleeding  Objective:    BP 118/71  Temp(Src) 98.3 F (36.8 C)  Wt 96.163 kg (212 lb)  LMP 11/01/2013  Physical Exam  Exam  FHT:  160 BPM  Uterine Size: Below umbilicus  Presentation: unsure     Assessment:    Pregnancy:  G3P0020  Candida vulvovaginitis  Plan:    Patient Active Problem List   Diagnosis Date Noted  . Unspecified vitamin D deficiency 12/12/2013  . Supervision of normal first pregnancy 12/07/2013  . Pregnancy complicated by previous recurrent miscarriages 12/07/2013   Orders Placed This Encounter  Procedures  . POCT urinalysis dipstick    U/S for anatomy Follow up in 4 wks.

## 2014-03-06 ENCOUNTER — Other Ambulatory Visit: Payer: Self-pay | Admitting: *Deleted

## 2014-03-06 DIAGNOSIS — Z1389 Encounter for screening for other disorder: Secondary | ICD-10-CM

## 2014-03-07 ENCOUNTER — Ambulatory Visit (INDEPENDENT_AMBULATORY_CARE_PROVIDER_SITE_OTHER): Payer: 59

## 2014-03-07 ENCOUNTER — Encounter: Payer: Self-pay | Admitting: Obstetrics & Gynecology

## 2014-03-07 DIAGNOSIS — Z1389 Encounter for screening for other disorder: Secondary | ICD-10-CM

## 2014-03-07 LAB — US OB COMP + 14 WK

## 2014-03-12 ENCOUNTER — Other Ambulatory Visit: Payer: Self-pay | Admitting: *Deleted

## 2014-03-12 DIAGNOSIS — O365990 Maternal care for other known or suspected poor fetal growth, unspecified trimester, not applicable or unspecified: Secondary | ICD-10-CM

## 2014-03-13 ENCOUNTER — Other Ambulatory Visit: Payer: 59

## 2014-03-15 ENCOUNTER — Encounter: Payer: 59 | Admitting: Obstetrics & Gynecology

## 2014-03-16 ENCOUNTER — Encounter: Payer: 59 | Admitting: Obstetrics

## 2014-03-29 ENCOUNTER — Encounter: Payer: 59 | Admitting: Obstetrics & Gynecology

## 2014-05-12 IMAGING — US US PELVIS COMPLETE
1 series · 14 of 25 positions shown · non-contrast
Comparison: None

CLINICAL DATA: Spontaneous miscarriage 2 weeks ago.  New-onset
heavy bleeding today.  Quantitative HCG 656



[Series 1: us pelvis complete · 0.30mm/px · 14 of 51 slices shown]
[im 1/51]
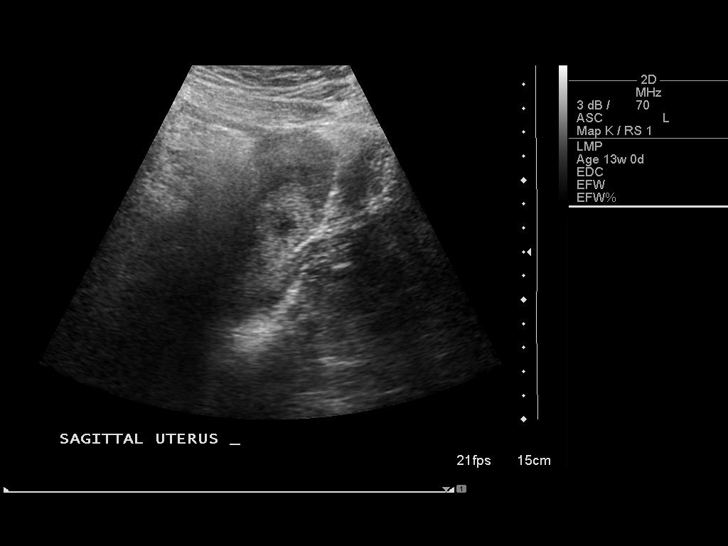
[im 5/51]
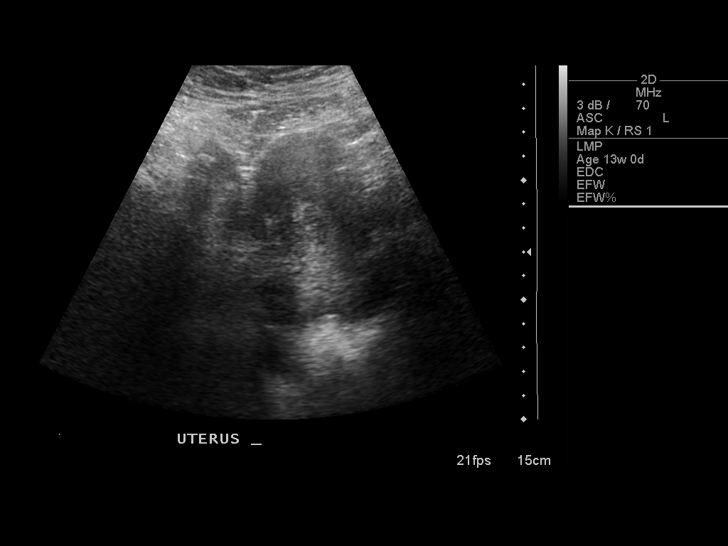
[im 9/51]
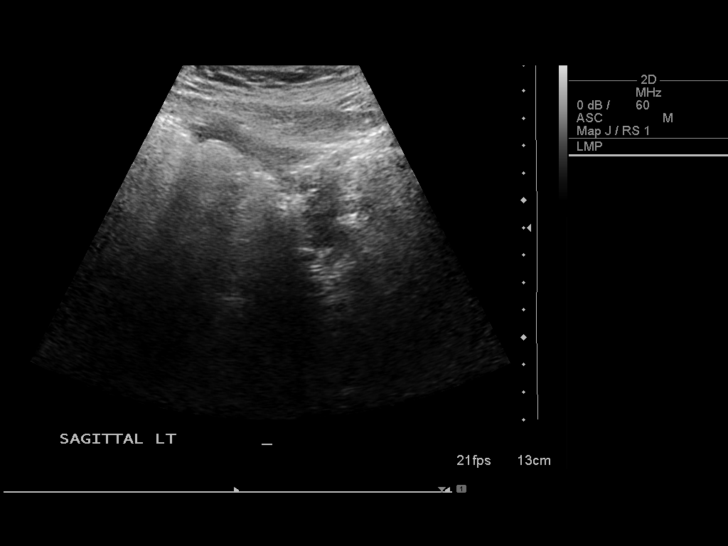
[im 13/51]
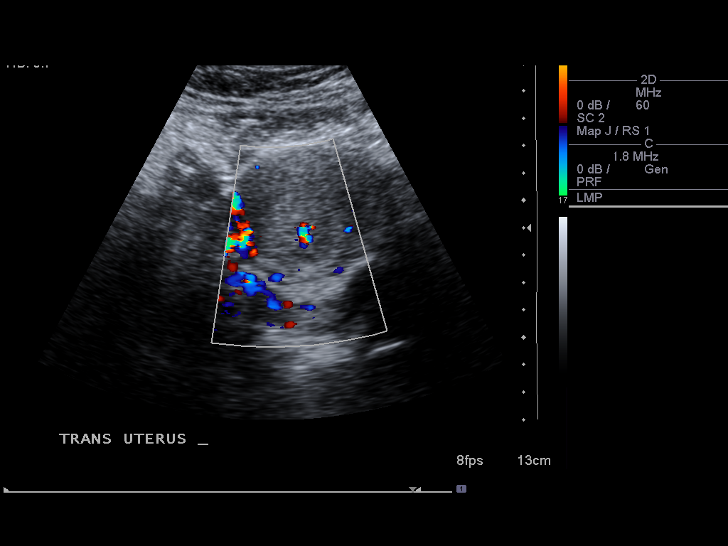
[im 17/51]
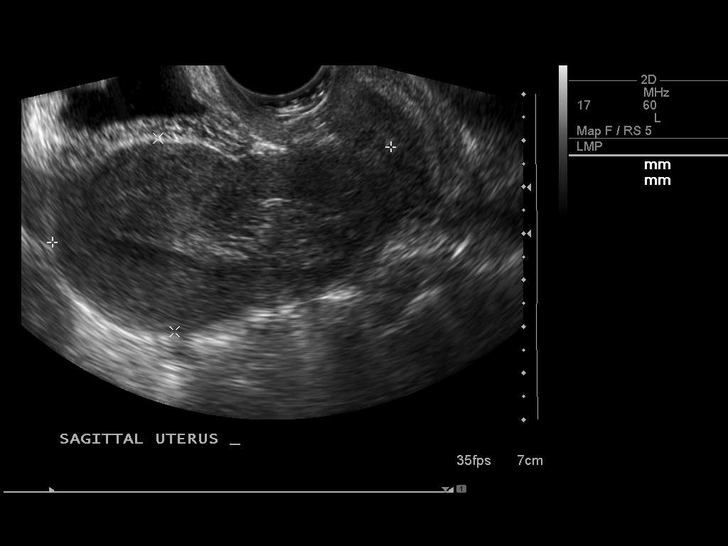
[im 19/51]
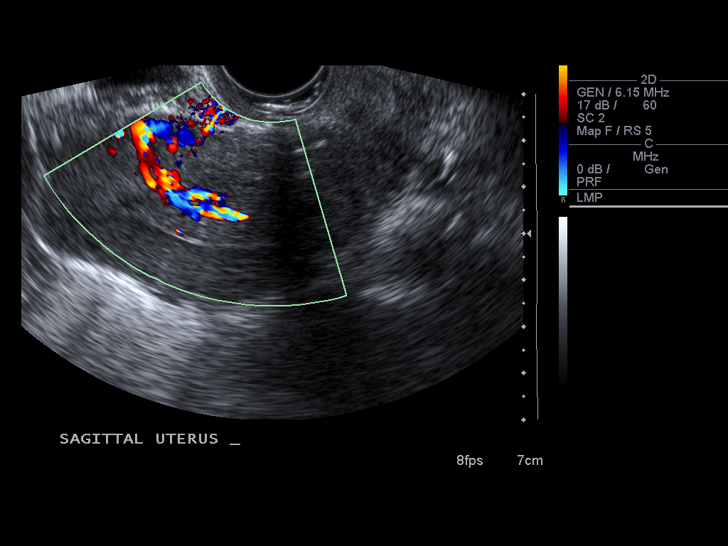
[im 23/51]
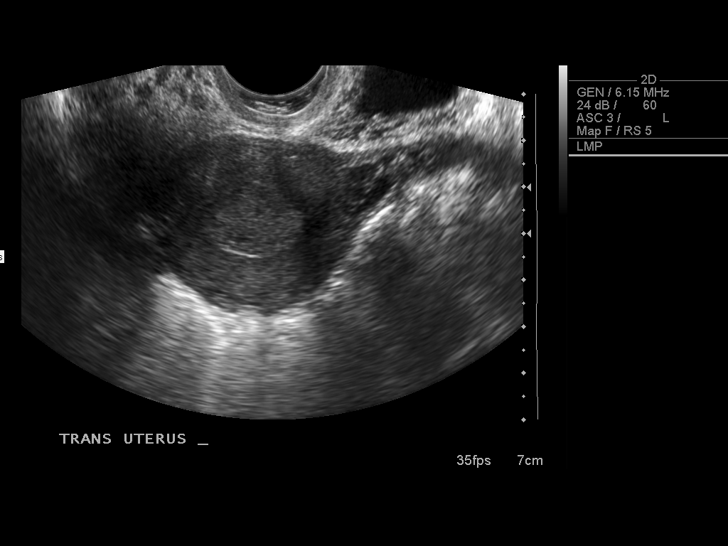
[im 28/51]
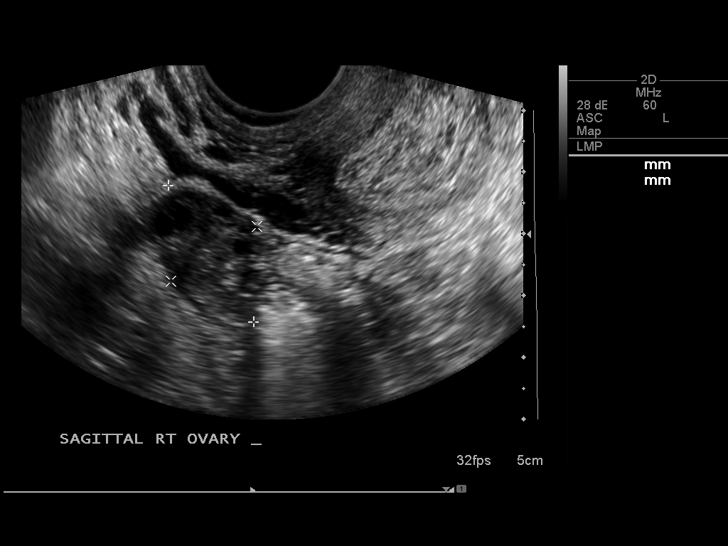
[im 32/51]
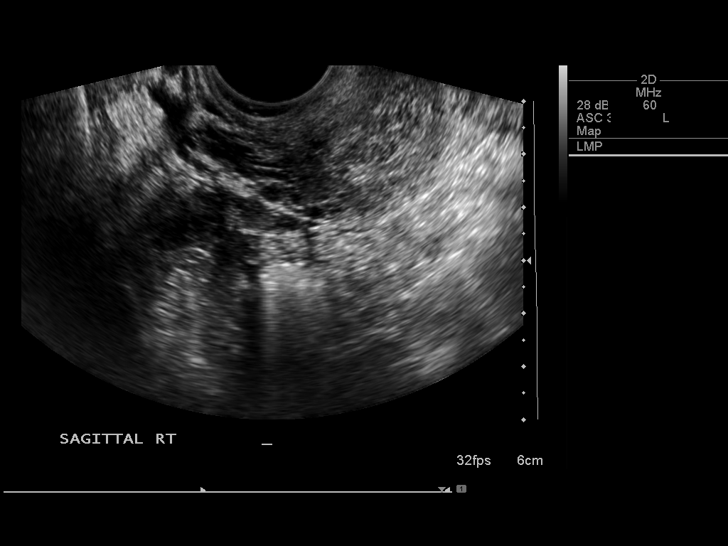
[im 34/51]
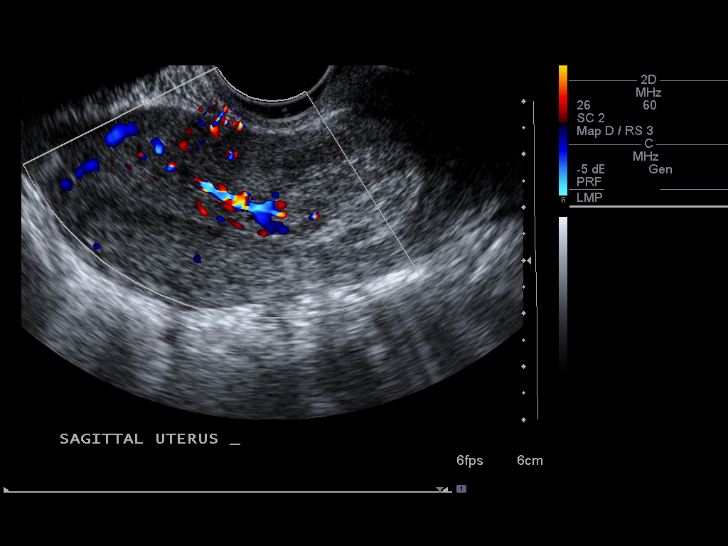
[im 38/51]
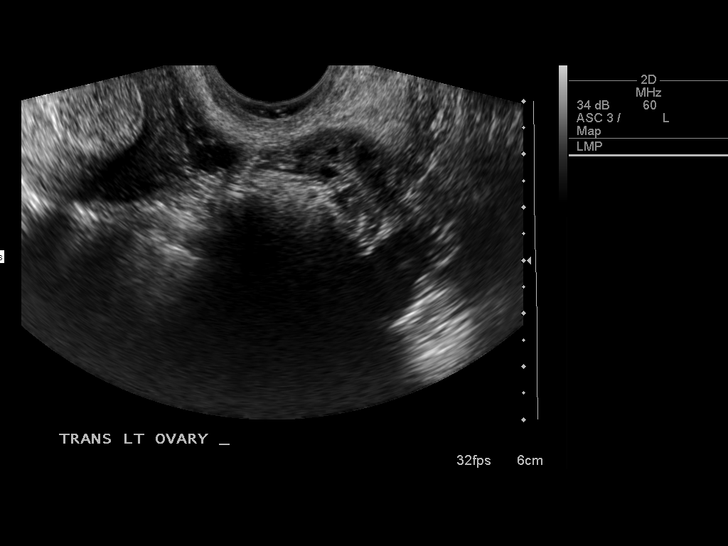
[im 42/51]
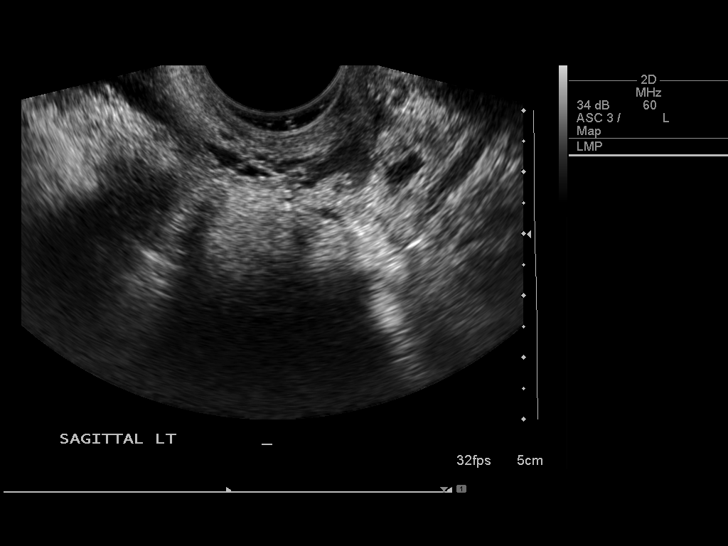
[im 46/51]
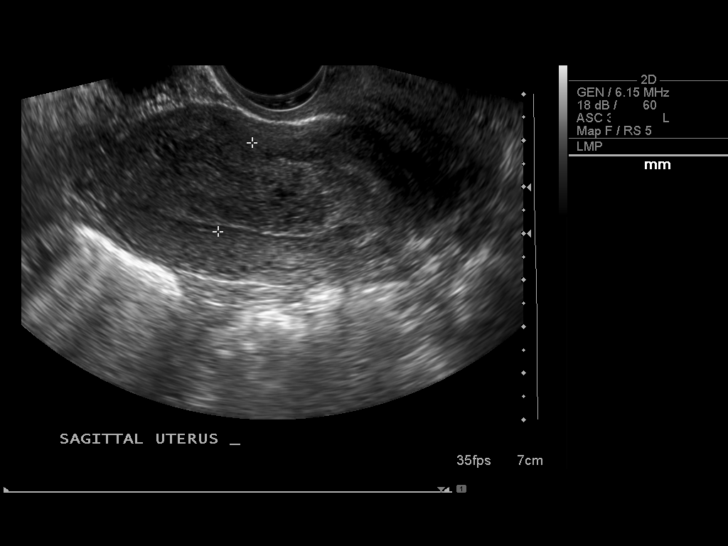
[im 51/51]
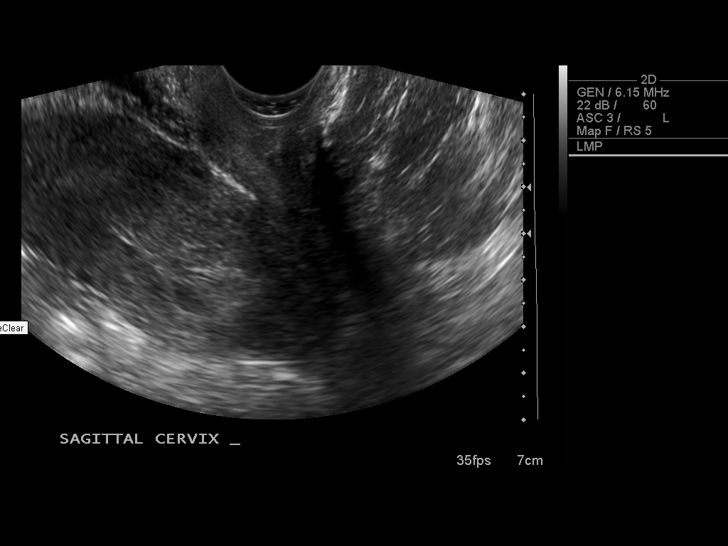

[14 of 25 positions shown; findings below may reference images not displayed]

FINDINGS: Uterus: Normal in size and appearance

Endometrium: Markedly thickened at 2.0 cm with heterogeneously
echogenic material.  There is Doppler signal within the
endometrium.

Right ovary:  Normal appearance/no adnexal mass

Left ovary: Normal appearance/no adnexal mass

Other findings: No free fluid
IMPRESSION: Findings compatible with retained products of conception..

## 2014-09-17 ENCOUNTER — Encounter (HOSPITAL_COMMUNITY): Payer: Self-pay | Admitting: General Practice

## 2014-10-12 ENCOUNTER — Encounter (HOSPITAL_COMMUNITY): Payer: Self-pay | Admitting: *Deleted

## 2014-11-12 ENCOUNTER — Encounter: Payer: Self-pay | Admitting: *Deleted

## 2014-11-13 ENCOUNTER — Encounter: Payer: Self-pay | Admitting: Obstetrics & Gynecology

## 2023-05-20 ENCOUNTER — Encounter (HOSPITAL_COMMUNITY): Payer: Self-pay

## 2023-05-20 ENCOUNTER — Emergency Department (HOSPITAL_COMMUNITY): Payer: Medicaid Other

## 2023-05-20 ENCOUNTER — Emergency Department (HOSPITAL_COMMUNITY)
Admission: EM | Admit: 2023-05-20 | Discharge: 2023-05-20 | Disposition: A | Discharge status: Home / Self Care | Payer: Medicaid Other | Attending: Emergency Medicine | Admitting: Emergency Medicine

## 2023-05-20 DIAGNOSIS — S8001XA Contusion of right knee, initial encounter: Secondary | ICD-10-CM | POA: Insufficient documentation

## 2023-05-20 DIAGNOSIS — S8991XA Unspecified injury of right lower leg, initial encounter: Secondary | ICD-10-CM | POA: Diagnosis present

## 2023-05-20 DIAGNOSIS — W19XXXA Unspecified fall, initial encounter: Secondary | ICD-10-CM | POA: Diagnosis not present

## 2023-05-20 DIAGNOSIS — M25561 Pain in right knee: Secondary | ICD-10-CM

## 2023-05-20 MED ORDER — FENTANYL CITRATE PF 50 MCG/ML IJ SOSY
50.0000 ug | PREFILLED_SYRINGE | Freq: Once | INTRAMUSCULAR | Status: AC
  Administered 2023-05-20: 50 ug via INTRAVENOUS
  Filled 2023-05-20: qty 1

## 2023-05-20 MED ORDER — OXYCODONE-ACETAMINOPHEN 5-325 MG PO TABS: 1.0000 | ORAL_TABLET | ORAL | 0 refills | Status: AC | PRN

## 2023-05-20 NOTE — Discharge Instructions (Signed)
Your history, exam, and eval today are consistent with a likely soft tissue injury to the knee or sprain but there was no evidence of acute bony injury.  Given the amount of pain you have, swelling, and bruising I do suspect there may be a nonbony injury present.  Please use the pain medicine to help with symptoms and keep it elevated.  Please use the crutches and knee immobilizer and call your local orthopedist for evaluation in the next few days if symptoms persist.

## 2023-05-20 NOTE — Progress Notes (Signed)
Orthopedic Tech Progress Note Patient Details:  [place] Dakin 02-04-1991 [id number]  Knee immobilizer placed and crutch training has been completed. Encouraged icing and elevation to help with pain and swelling. Pt is from out of town so will not being able to f/u with a local orthopedist, but is aware of needing to make an appointment with ortho where she lives.  Ortho Devices Type of Ortho Device: Knee Immobilizer, Crutches Ortho Device/Splint Location: RLE, crutches at bedside with family Ortho Device/Splint Interventions: Ordered, Application, Adjustment   Post Interventions Patient Tolerated: Fair Instructions Provided: Care of device, Adjustment of device, Poper ambulation with device  Docia Furl 05/20/2023, 5:57 PM

## 2023-05-20 NOTE — ED Triage Notes (Signed)
Pt BIB [facility] from [facility]. Pt was slipped in the wave pool and fell on right knee. Pain 10/10. Knee splinted and abrasion noted to right now.

## 2023-05-20 NOTE — ED Provider Notes (Signed)
[place] EMERGENCY DEPARTMENT AT [facility] Provider Note   CSN: [id number] Arrival date & time: 05/20/23  1441     History  Chief Complaint  Patient presents with   Knee Pain    Lynn Holland is a 32 y.o. female.  The history is provided by the spouse, the patient and medical records. No language interpreter was used.  Knee Pain Location:  Knee Time since incident:  1 hour Injury: yes   Mechanism of injury: fall   Knee location:  R knee Pain details:    Quality:  Aching   Radiates to:  Does not radiate   Severity:  Severe   Timing:  Constant   Progression:  Unchanged Chronicity:  New Prior injury to area:  No Relieved by:  Nothing Worsened by:  Bearing weight, flexion and extension Ineffective treatments:  None tried Associated symptoms: swelling and tingling   Associated symptoms: no back pain, no fatigue, no fever, no muscle weakness and no numbness        Home Medications Prior to Admission medications   Medication Sig Start Date End Date Taking? Authorizing Provider  acetaminophen (TYLENOL) 325 MG tablet Take by mouth every 6 (six) hours as needed for headache.    [provider]  fluconazole (DIFLUCAN) 150 MG tablet Take 1 tablet (150 mg total) by mouth once. 02/28/14   Poe, Deirdre C, CNM  Prenat-FeCbn-FeAspGl-FA-Omega (OB COMPLETE PETITE) 35-5-1-200 MG CAPS Take 1 capsule by mouth daily. 01/17/14   Antionette Char, MD      Allergies    Patient has no known allergies.    Review of Systems   Review of Systems  Constitutional:  Negative for chills, fatigue and fever.  HENT:  Negative for congestion.   Respiratory:  Negative for cough and chest tightness.   Cardiovascular:  Negative for chest pain.  Gastrointestinal:  Negative for abdominal pain.  Genitourinary:  Negative for flank pain.  Musculoskeletal:  Negative for back pain.  Neurological:  Negative for weakness and headaches.  Psychiatric/Behavioral:  Negative for agitation.    All other systems reviewed and are negative.   Physical Exam Updated Vital Signs BP 109/66 (BP Location: Left Arm)   Pulse 83   Temp 98.3 F (36.8 C) (Oral)   Resp 18   SpO2 100%  Physical Exam Vitals and nursing note reviewed.  Constitutional:      General: She is not in acute distress.    Appearance: She is well-developed. She is not ill-appearing, toxic-appearing or diaphoretic.  HENT:     Head: Normocephalic and atraumatic.     Nose: Nose normal.     Mouth/Throat:     Mouth: Mucous membranes are moist.  Eyes:     Conjunctiva/sclera: Conjunctivae normal.  Cardiovascular:     Rate and Rhythm: Normal rate and regular rhythm.     Heart sounds: No murmur heard. Pulmonary:     Effort: Pulmonary effort is normal. No respiratory distress.     Breath sounds: Normal breath sounds. No wheezing, rhonchi or rales.  Chest:     Chest wall: No tenderness.  Abdominal:     Palpations: Abdomen is soft.     Tenderness: There is no abdominal tenderness.  Musculoskeletal:        General: Swelling, tenderness and signs of injury present.     Cervical back: Neck supple.     Right lower leg: No edema.     Left lower leg: No edema.  Skin:  General: Skin is warm and dry.     Capillary Refill: Capillary refill takes less than 2 seconds.     Findings: Bruising present. No erythema or rash.  Neurological:     General: No focal deficit present.     Mental Status: She is alert.  Psychiatric:        Mood and Affect: Mood normal.     ED Results / Procedures / Treatments   Labs (all labs ordered are listed, but only abnormal results are displayed) Labs Reviewed - No data to display  EKG None  Radiology DG Knee Complete 4 Views Right  Result Date: 05/20/2023 CLINICAL DATA:  Knee pain after fall EXAM: RIGHT KNEE - COMPLETE 4 VIEW COMPARISON:  None Available. FINDINGS: No evidence of fracture, dislocation, or joint effusion. No evidence of arthropathy or other focal bone abnormality.  Soft tissues are unremarkable. IMPRESSION: Negative. Electronically Signed   By: Jacob Moores M.D.   On: 05/20/2023 16:00    Procedures Procedures    Medications Ordered in ED Medications  fentaNYL (SUBLIMAZE) injection 50 mcg (has no administration in time range)  fentaNYL (SUBLIMAZE) injection 50 mcg (50 mcg Intravenous Given 05/20/23 1523)    ED Course/ Medical Decision Making/ A&P                             Medical Decision Making Amount and/or Complexity of Data Reviewed Radiology: ordered.  Risk Prescription drug management.    Lynn [place] Hessel is a 32 y.o. female with no significant past medical history who presents with right knee injury after a fall.  According to patient, she was getting ready to get in the water at a water park holding her child on her hip when she started slipping and fell.  She reports her left leg went out from under her to the side and her right knee buckled going to the ground.  She did not hit her head and is only complaining of pain in her right knee.  She has not been able to walk on it.  She had it splinted and brought in by EMS.  She reports no history of knee surgeries and did not actually hit the knee on the ground.  She denies any preceding symptoms and reports the pain is severe.  On exam, lungs clear.  Chest nontender.  Abdomen nontender.  Hips nontender.  Patient has tenderness primarily in the medial side of the right knee and there is some swelling.  There is an abrasion anterior and inferiorly on the knee.  She would not let me bend it.  She had intact distal pulses and was able to wiggle her toes.  She reported some tingling but otherwise had sensation.  Rest of the exam unremarkable.  Will get x-rays to look for bony injuries but due to the lack of impact I have more suspicion for ligamentous tenderness or other soft tissue injuries.  Patient received some fentanyl to help with pain.  Anticipate reassessment after imaging to determine  disposition.       5:03 PM X-ray returned without acute fracture.  No evidence of dislocation or subluxation.  Suspect sprain versus nonbony injury.  Given the amount of discomfort, swelling, and bruising I am concerned about ligamentous meniscus or other soft tissue injury.  Will place knee immobilizer, give crutches, and given prescription for pain medicine.  Patient is from another city and she will call her local orthopedist for evaluation  in the next few days to determine if she needs outpatient advanced imaging.  Patient agreed to plan of care and was given a dose of pain medicine prior to discharge.  Patient discharged in good condition.         Final Clinical Impression(s) / ED Diagnoses Final diagnoses:  Acute pain of right knee  Fall, initial encounter    Rx / DC Orders ED Discharge Orders          Ordered    oxyCODONE-acetaminophen (PERCOCET/ROXICET) 5-325 MG tablet  Every 4 hours PRN        05/20/23 1706            Clinical Impression: 1. Acute pain of right knee   2. Fall, initial encounter     Disposition: Discharge  Condition: Good  I have discussed the results, Dx and Tx plan with the pt(& family if present). He/she/they expressed understanding and agree(s) with the plan. Discharge instructions discussed at great length. Strict return precautions discussed and pt &/or family have verbalized understanding of the instructions. No further questions at time of discharge.    New Prescriptions   OXYCODONE-ACETAMINOPHEN (PERCOCET/ROXICET) 5-325 MG TABLET    Take 1 tablet by mouth every 4 (four) hours as needed for severe pain.    Follow Up: your local orthopedic doctor     [facility] Emergency Department at [facility] [address] [id number] mc [place] [address] [phone]         Erinn Mendosa, Canary Brim, MD 05/20/23 [phone]
